# Patient Record
Sex: Male | Born: 1945
Health system: Southern US, Community
[De-identification: ages and names within clinical notes are randomized; demographics above are authoritative.]

## PROBLEM LIST (undated history)

## (undated) DIAGNOSIS — K5792 Diverticulitis of intestine, part unspecified, without perforation or abscess without bleeding: Secondary | ICD-10-CM

## (undated) DIAGNOSIS — N529 Male erectile dysfunction, unspecified: Secondary | ICD-10-CM

## (undated) DIAGNOSIS — T783XXA Angioneurotic edema, initial encounter: Secondary | ICD-10-CM

## (undated) DIAGNOSIS — N200 Calculus of kidney: Secondary | ICD-10-CM

## (undated) DIAGNOSIS — E785 Hyperlipidemia, unspecified: Secondary | ICD-10-CM

## (undated) DIAGNOSIS — L509 Urticaria, unspecified: Secondary | ICD-10-CM

## (undated) HISTORY — PX: POLYPECTOMY: SHX149

## (undated) HISTORY — PX: TONSILLECTOMY: SUR1361

## (undated) HISTORY — DX: Calculus of kidney: N20.0

## (undated) HISTORY — DX: Urticaria, unspecified: L50.9

## (undated) HISTORY — PX: OTHER SURGICAL HISTORY: SHX169

## (undated) HISTORY — DX: Diverticulitis of intestine, part unspecified, without perforation or abscess without bleeding: K57.92

## (undated) HISTORY — PX: COLONOSCOPY: SHX174

## (undated) HISTORY — DX: Male erectile dysfunction, unspecified: N52.9

## (undated) HISTORY — DX: Hyperlipidemia, unspecified: E78.5

## (undated) HISTORY — DX: Angioneurotic edema, initial encounter: T78.3XXA

---

## 2002-07-09 ENCOUNTER — Encounter: Payer: Self-pay | Admitting: Emergency Medicine

## 2002-07-09 ENCOUNTER — Emergency Department (HOSPITAL_COMMUNITY): Admission: EM | Admit: 2002-07-09 | Discharge: 2002-07-09 | Payer: Self-pay | Admitting: Emergency Medicine

## 2011-08-28 ENCOUNTER — Encounter: Payer: Self-pay | Admitting: Gastroenterology

## 2011-09-08 ENCOUNTER — Ambulatory Visit (AMBULATORY_SURGERY_CENTER): Payer: 59 | Admitting: *Deleted

## 2011-09-08 ENCOUNTER — Encounter: Payer: Self-pay | Admitting: Gastroenterology

## 2011-09-08 VITALS — Ht 74.0 in | Wt 220.0 lb

## 2011-09-08 DIAGNOSIS — Z1211 Encounter for screening for malignant neoplasm of colon: Secondary | ICD-10-CM

## 2011-09-08 MED ORDER — PEG-KCL-NACL-NASULF-NA ASC-C 100 G PO SOLR: ORAL | Status: DC

## 2011-09-25 ENCOUNTER — Encounter: Payer: Self-pay | Admitting: Gastroenterology

## 2011-09-25 ENCOUNTER — Ambulatory Visit (AMBULATORY_SURGERY_CENTER): Payer: 59 | Admitting: Gastroenterology

## 2011-09-25 VITALS — BP 134/85 | HR 66 | Temp 96.0°F | Resp 22 | Ht 74.0 in | Wt 220.0 lb

## 2011-09-25 DIAGNOSIS — D126 Benign neoplasm of colon, unspecified: Secondary | ICD-10-CM

## 2011-09-25 DIAGNOSIS — Z1211 Encounter for screening for malignant neoplasm of colon: Secondary | ICD-10-CM

## 2011-09-25 DIAGNOSIS — K5730 Diverticulosis of large intestine without perforation or abscess without bleeding: Secondary | ICD-10-CM

## 2011-09-25 MED ORDER — SODIUM CHLORIDE 0.9 % IV SOLN: 500.0000 mL | INTRAVENOUS | Status: DC

## 2011-09-25 NOTE — Progress Notes (Signed)
Patient did not experience any of the following events: a burn prior to discharge; a fall within the facility; wrong site/side/patient/procedure/implant event; or a hospital transfer or hospital admission upon discharge from the facility. (G8907) Patient did not have preoperative order for IV antibiotic SSI prophylaxis. (G8918)  

## 2011-09-25 NOTE — Patient Instructions (Signed)
YOU HAD AN ENDOSCOPIC PROCEDURE TODAY AT THE [facility]: Refer to the procedure report that was given to you for any specific questions about what was found during the examination.  If the procedure report does not answer your questions, please call your gastroenterologist to clarify.  If you requested that your care partner not be given the details of your procedure findings, then the procedure report has been included in a sealed envelope for you to review at your convenience later.  YOU SHOULD EXPECT: Some feelings of bloating in the abdomen. Passage of more gas than usual.  Walking can help get rid of the air that was put into your GI tract during the procedure and reduce the bloating. If you had a lower endoscopy (such as a colonoscopy or flexible sigmoidoscopy) you may notice spotting of blood in your stool or on the toilet paper. If you underwent a bowel prep for your procedure, then you may not have a normal bowel movement for a few days.  DIET: Your first meal following the procedure should be a light meal and then it is ok to progress to your normal diet.  A half-sandwich or bowl of soup is an example of a good first meal.  Heavy or fried foods are harder to digest and may make you feel nauseous or bloated.  Likewise meals heavy in dairy and vegetables can cause extra gas to form and this can also increase the bloating.  Drink plenty of fluids but you should avoid alcoholic beverages for 24 hours.  ACTIVITY: Your care partner should take you home directly after the procedure.  You should plan to take it easy, moving slowly for the rest of the day.  You can resume normal activity the day after the procedure however you should NOT DRIVE or use heavy machinery for 24 hours (because of the sedation medicines used during the test).    SYMPTOMS TO REPORT IMMEDIATELY: A gastroenterologist can be reached at any hour.  During normal business hours, 8:30 AM to 5:00 PM Monday through Friday,  call [phone].  After hours and on weekends, please call the GI answering service at [phone] who will take a message and have the physician on call contact you.   Following lower endoscopy (colonoscopy or flexible sigmoidoscopy):  Excessive amounts of blood in the stool  Significant tenderness or worsening of abdominal pains  Swelling of the abdomen that is new, acute  Fever of 100F or higher  Following upper endoscopy (EGD)  Vomiting of blood or coffee ground material  New chest pain or pain under the shoulder blades  Painful or persistently difficult swallowing  New shortness of breath  Fever of 100F or higher  Black, tarry-looking stools  FOLLOW UP: If any biopsies were taken you will be contacted by phone or by letter within the next 1-3 weeks.  Call your gastroenterologist if you have not heard about the biopsies in 3 weeks.  Our staff will call the home number listed on your records the next business day following your procedure to check on you and address any questions or concerns that you may have at that time regarding the information given to you following your procedure. This is a courtesy call and so if there is no answer at the home number and we have not heard from you through the emergency physician on call, we will assume that you have returned to your regular daily activities without incident.  SIGNATURES/CONFIDENTIALITY: You and/or your care   partner have signed paperwork which will be entered into your electronic medical record.  These signatures attest to the fact that that the information above on your After Visit Summary has been reviewed and is understood.  Full responsibility of the confidentiality of this discharge information lies with you and/or your care-partner.  

## 2011-09-25 NOTE — Op Note (Signed)
[facility] [address] [place], [place]  [address]  COLONOSCOPY PROCEDURE REPORT  PATIENT:  Dennis Diaz, Dennis Diaz  MR#:  [id number] BIRTHDATE:  21-Apr-1946, 66 yrs. old  GENDER:  male ENDOSCOPIST:  Barbette Hair. Arlyce Dice, MD REF. BY:  Nila Nephew, M.D. PROCEDURE DATE:  09/25/2011 PROCEDURE:  Colonoscopy with snare polypectomy, Colon with cold biopsy polypectomy ASA CLASS:  Class I INDICATIONS:  Routine Risk Screening MEDICATIONS:   MAC sedation, administered by CRNA propofol 350mg IV  DESCRIPTION OF PROCEDURE:   After the risks benefits and alternatives of the procedure were thoroughly explained, informed consent was obtained.  Digital rectal exam was performed and revealed no abnormalities.   The [id number] endoscope was introduced through the anus and advanced to the cecum, which was identified by both the appendix and ileocecal valve, without limitations.  The quality of the prep was good, using MoviPrep. The instrument was then slowly withdrawn as the colon was fully examined. <<PROCEDUREIMAGES>>  FINDINGS:  A sessile polyp was found at the hepatic flexure. It was 3 mm in size. Polyp was snared without cautery. Retrieval was successful (see image3). snare polyp  A sessile polyp was found in the distal transverse colon. It was 2 mm in size. The polyp was removed using cold biopsy forceps (see image5).  Mild diverticulosis was found in the sigmoid colon (see image7).  This was otherwise a normal examination of the colon (see image2 and image8).   Retroflexed views in the rectum revealed no abnormalities.    The time to cecum =  1) 1.75  minutes. The scope was then withdrawn in  1) 11.75  minutes from the cecum and the procedure completed. COMPLICATIONS:  None ENDOSCOPIC IMPRESSION: 1) 3 mm sessile polyp at the hepatic flexure 2) 2 mm sessile polyp in the distal transverse colon 3) Mild diverticulosis in the sigmoid colon 4) Otherwise normal  examination RECOMMENDATIONS: 1) If the polyp(s) removed today are proven to be adenomatous (pre-cancerous) polyps, you will need a repeat colonoscopy in 5 years. Otherwise you should continue to follow colorectal cancer screening guidelines for "routine risk" patients with colonoscopy in 10 years. You will receive a letter within 1-2 weeks with the results of your biopsy as well as final recommendations. Please call my office if you have not received a letter after 3 weeks. REPEAT EXAM:   You will receive a letter from Dr. Arlyce Dice in 1-2 weeks, after reviewing the final pathology, with followup recommendations.  ______________________________ Barbette Hair Arlyce Dice, MD  CC:  n. eSIGNED:   Barbette Hair. Goku Harb at 09/25/2011 09:22 AM  Florentina Jenny, [phone]

## 2011-09-25 NOTE — Progress Notes (Signed)
PROPOFOL PER S CAMP CRNA. SEE SCANNED INTRA PROCEDURE REPORT. EWM 

## 2011-09-29 ENCOUNTER — Telehealth: Payer: Self-pay | Admitting: *Deleted

## 2011-09-29 NOTE — Telephone Encounter (Signed)
  Follow up Call-  Call back number 09/25/2011  Post procedure Call Back phone  # [phone]  Permission to leave phone message Yes     No answer at #given.

## 2011-10-03 ENCOUNTER — Encounter: Payer: Self-pay | Admitting: Gastroenterology

## 2012-03-04 ENCOUNTER — Ambulatory Visit (INDEPENDENT_AMBULATORY_CARE_PROVIDER_SITE_OTHER): Payer: 59 | Admitting: Physician Assistant

## 2012-03-04 VITALS — BP 132/82 | HR 98 | Temp 98.3°F | Resp 14 | Ht 70.5 in | Wt 220.4 lb

## 2012-03-04 DIAGNOSIS — L03319 Cellulitis of trunk, unspecified: Secondary | ICD-10-CM

## 2012-03-04 DIAGNOSIS — L02219 Cutaneous abscess of trunk, unspecified: Secondary | ICD-10-CM

## 2012-03-04 MED ORDER — DOXYCYCLINE HYCLATE 100 MG PO CAPS: 100.0000 mg | ORAL_CAPSULE | Freq: Two times a day (BID) | ORAL | Status: AC

## 2012-03-04 NOTE — Patient Instructions (Signed)
Apply warm compresses to the area for 15-20 minutes 2-3 times daily.

## 2012-03-04 NOTE — Progress Notes (Signed)
  Subjective:    Patient ID: Dennis Diaz, male    DOB: 1946-04-25, 66 y.o.   MRN: [id number]  HPI This 66 y.o. male presents for evaluation of bite on the left hip, which he is concerned may be infected.  Had returned from the beach several days ago and noticed a bug on the hip while showering.  It was flat, like a tick, and he removed it, but did not examine it closely.  The site has become progressively red and a little swollen.  No fever, chills, nausea, abdominal pain, HA, dizziness, unexplained myalgias or arthralgias.   Review of Systems As above.   Past Medical History  Diagnosis Date  . Kidney stones   . ED (erectile dysfunction)     Past Surgical History  Procedure Date  . Root canal     and redo    Prior to Admission medications   Medication Sig Start Date End Date Taking? Authorizing Provider  VIAGRA 50 MG tablet Take 1 tablet by mouth as needed. 08/01/11   Historical Provider, MD    No Known Allergies  History   Social History  . Marital Status: Married    Spouse Name: Cotten    Number of Children: 2  . Years of Education: 18   Occupational History  . VP of Operations [employer]   Social History Main Topics  . Smoking status: Never Smoker   . Smokeless tobacco: Never Used  . Alcohol Use: 2.4 oz/week    4 Glasses of wine per week  . Drug Use: No  . Sexually Active: Yes -- Male partner(s)   Other Topics Concern  . Not on file   Social History Narrative  . No narrative on file    Family History  Problem Relation Age of Onset  . Alzheimer's disease Mother   . Heart disease Father 56    CABG  . Diabetes Father        Objective:   Physical Exam  Vitals reviewed. Constitutional: He is oriented to person, place, and time. Vital signs are normal. He appears well-developed and well-nourished. He is active and cooperative. No distress.  HENT:  Head: Normocephalic and atraumatic.  Right Ear: Hearing normal.  Left Ear: Hearing normal.    Eyes: EOM are normal. Pupils are equal, round, and reactive to light.  Pulmonary/Chest: Effort normal.  Neurological: He is alert and oriented to person, place, and time. No sensory deficit.  Skin: Skin is warm and dry. Lesion noted. There is erythema.     Psychiatric: He has a normal mood and affect. His speech is normal and behavior is normal. Judgment normal.      Assessment & Plan:   1. Cellulitis and abscess of trunk  doxycycline (VIBRAMYCIN) 100 MG capsule   Cover for tick-borne illness as above, though no current symptoms.  RTC if symptoms worsen or persist.

## 2014-02-17 ENCOUNTER — Encounter: Payer: Self-pay | Admitting: Gastroenterology

## 2015-04-15 ENCOUNTER — Emergency Department (HOSPITAL_BASED_OUTPATIENT_CLINIC_OR_DEPARTMENT_OTHER): Payer: Medicare Other

## 2015-04-15 ENCOUNTER — Inpatient Hospital Stay (HOSPITAL_BASED_OUTPATIENT_CLINIC_OR_DEPARTMENT_OTHER)
Admission: EM | Admit: 2015-04-15 | Discharge: 2015-04-22 | DRG: 392 | Disposition: A | Discharge status: Home / Self Care | Payer: Medicare Other | Attending: Internal Medicine | Admitting: Internal Medicine

## 2015-04-15 ENCOUNTER — Encounter (HOSPITAL_BASED_OUTPATIENT_CLINIC_OR_DEPARTMENT_OTHER): Payer: Self-pay | Admitting: *Deleted

## 2015-04-15 DIAGNOSIS — Z8249 Family history of ischemic heart disease and other diseases of the circulatory system: Secondary | ICD-10-CM

## 2015-04-15 DIAGNOSIS — Z79899 Other long term (current) drug therapy: Secondary | ICD-10-CM

## 2015-04-15 DIAGNOSIS — Z833 Family history of diabetes mellitus: Secondary | ICD-10-CM

## 2015-04-15 DIAGNOSIS — Z820 Family history of epilepsy and other diseases of the nervous system: Secondary | ICD-10-CM | POA: Diagnosis not present

## 2015-04-15 DIAGNOSIS — R918 Other nonspecific abnormal finding of lung field: Secondary | ICD-10-CM

## 2015-04-15 DIAGNOSIS — I493 Ventricular premature depolarization: Secondary | ICD-10-CM | POA: Diagnosis not present

## 2015-04-15 DIAGNOSIS — K5720 Diverticulitis of large intestine with perforation and abscess without bleeding: Secondary | ICD-10-CM | POA: Diagnosis not present

## 2015-04-15 DIAGNOSIS — Z87442 Personal history of urinary calculi: Secondary | ICD-10-CM

## 2015-04-15 DIAGNOSIS — R509 Fever, unspecified: Secondary | ICD-10-CM | POA: Diagnosis not present

## 2015-04-15 DIAGNOSIS — R109 Unspecified abdominal pain: Secondary | ICD-10-CM | POA: Diagnosis not present

## 2015-04-15 DIAGNOSIS — K5780 Diverticulitis of intestine, part unspecified, with perforation and abscess without bleeding: Secondary | ICD-10-CM | POA: Diagnosis not present

## 2015-04-15 DIAGNOSIS — R000 Tachycardia, unspecified: Secondary | ICD-10-CM | POA: Diagnosis not present

## 2015-04-15 LAB — URINALYSIS, ROUTINE W REFLEX MICROSCOPIC
Bilirubin Urine: NEGATIVE
Glucose, UA: NEGATIVE mg/dL
HGB URINE DIPSTICK: NEGATIVE
Ketones, ur: 40 mg/dL — AB
Leukocytes, UA: NEGATIVE
Nitrite: NEGATIVE
Protein, ur: NEGATIVE mg/dL
SPECIFIC GRAVITY, URINE: 1.025 (ref 1.005–1.030)
UROBILINOGEN UA: 0.2 mg/dL (ref 0.0–1.0)
pH: 5 (ref 5.0–8.0)

## 2015-04-15 LAB — CBC WITH DIFFERENTIAL/PLATELET
Basophils Absolute: 0 10*3/uL (ref 0.0–0.1)
Basophils Relative: 0 %
Eosinophils Absolute: 0 10*3/uL (ref 0.0–0.7)
Eosinophils Relative: 0 %
HEMATOCRIT: 45.1 % (ref 39.0–52.0)
HEMOGLOBIN: 15.4 g/dL (ref 13.0–17.0)
LYMPHS ABS: 0.6 10*3/uL — AB (ref 0.7–4.0)
Lymphocytes Relative: 5 %
MCH: 29.1 pg (ref 26.0–34.0)
MCHC: 34.1 g/dL (ref 30.0–36.0)
MCV: 85.1 fL (ref 78.0–100.0)
MONO ABS: 0.5 10*3/uL (ref 0.1–1.0)
MONOS PCT: 5 %
NEUTROS ABS: 10.8 10*3/uL — AB (ref 1.7–7.7)
NEUTROS PCT: 90 %
Platelets: 199 10*3/uL (ref 150–400)
RBC: 5.3 MIL/uL (ref 4.22–5.81)
RDW: 13.3 % (ref 11.5–15.5)
WBC: 12.1 10*3/uL — ABNORMAL HIGH (ref 4.0–10.5)

## 2015-04-15 LAB — COMPREHENSIVE METABOLIC PANEL
ALK PHOS: 73 U/L (ref 38–126)
ALT: 23 U/L (ref 17–63)
ANION GAP: 9 (ref 5–15)
AST: 24 U/L (ref 15–41)
Albumin: 4.5 g/dL (ref 3.5–5.0)
BILIRUBIN TOTAL: 1.1 mg/dL (ref 0.3–1.2)
BUN: 14 mg/dL (ref 6–20)
CALCIUM: 9 mg/dL (ref 8.9–10.3)
CO2: 26 mmol/L (ref 22–32)
Chloride: 103 mmol/L (ref 101–111)
Creatinine, Ser: 1.04 mg/dL (ref 0.61–1.24)
GLUCOSE: 108 mg/dL — AB (ref 65–99)
Potassium: 4 mmol/L (ref 3.5–5.1)
Sodium: 138 mmol/L (ref 135–145)
TOTAL PROTEIN: 7.7 g/dL (ref 6.5–8.1)

## 2015-04-15 LAB — I-STAT CG4 LACTIC ACID, ED
LACTIC ACID, VENOUS: 1.35 mmol/L (ref 0.5–2.0)
LACTIC ACID, VENOUS: 1.05 mmol/L (ref 0.5–2.0)

## 2015-04-15 LAB — LIPASE, BLOOD: LIPASE: 25 U/L (ref 22–51)

## 2015-04-15 MED ORDER — ENOXAPARIN SODIUM 40 MG/0.4ML ~~LOC~~ SOLN
40.0000 mg | Freq: Every day | SUBCUTANEOUS | Status: DC
  Administered 2015-04-15 – 2015-04-21 (×7): 40 mg via SUBCUTANEOUS
  Filled 2015-04-15 (×7): qty 0.4

## 2015-04-15 MED ORDER — ACETAMINOPHEN 325 MG PO TABS
ORAL_TABLET | ORAL | Status: AC
  Filled 2015-04-15: qty 2

## 2015-04-15 MED ORDER — IOHEXOL 300 MG/ML  SOLN
25.0000 mL | Freq: Once | INTRAMUSCULAR | Status: AC | PRN
  Administered 2015-04-15: 25 mL via ORAL

## 2015-04-15 MED ORDER — ONDANSETRON HCL 4 MG PO TABS: 4.0000 mg | ORAL_TABLET | Freq: Four times a day (QID) | ORAL | Status: DC | PRN

## 2015-04-15 MED ORDER — OXYCODONE HCL 5 MG PO TABS
5.0000 mg | ORAL_TABLET | ORAL | Status: DC | PRN
  Administered 2015-04-16 – 2015-04-17 (×5): 5 mg via ORAL
  Filled 2015-04-15 (×5): qty 1

## 2015-04-15 MED ORDER — SODIUM CHLORIDE 0.9 % IJ SOLN
3.0000 mL | Freq: Two times a day (BID) | INTRAMUSCULAR | Status: DC
  Administered 2015-04-16 – 2015-04-22 (×7): 3 mL via INTRAVENOUS

## 2015-04-15 MED ORDER — METRONIDAZOLE IN NACL 5-0.79 MG/ML-% IV SOLN
500.0000 mg | Freq: Once | INTRAVENOUS | Status: AC
  Administered 2015-04-15: 500 mg via INTRAVENOUS
  Filled 2015-04-15: qty 100

## 2015-04-15 MED ORDER — CIPROFLOXACIN IN D5W 400 MG/200ML IV SOLN
400.0000 mg | Freq: Two times a day (BID) | INTRAVENOUS | Status: DC
  Filled 2015-04-15: qty 200

## 2015-04-15 MED ORDER — METRONIDAZOLE IN NACL 5-0.79 MG/ML-% IV SOLN
500.0000 mg | Freq: Three times a day (TID) | INTRAVENOUS | Status: DC
  Filled 2015-04-15: qty 100

## 2015-04-15 MED ORDER — ACETAMINOPHEN 325 MG PO TABS
650.0000 mg | ORAL_TABLET | Freq: Four times a day (QID) | ORAL | Status: DC | PRN
  Administered 2015-04-16 – 2015-04-20 (×5): 650 mg via ORAL
  Filled 2015-04-15 (×5): qty 2

## 2015-04-15 MED ORDER — ACETAMINOPHEN 650 MG RE SUPP
650.0000 mg | Freq: Four times a day (QID) | RECTAL | Status: DC | PRN
Start: 2015-04-15 — End: 2015-04-22

## 2015-04-15 MED ORDER — SODIUM CHLORIDE 0.9 % IV SOLN
INTRAVENOUS | Status: DC
  Administered 2015-04-15 – 2015-04-19 (×8): via INTRAVENOUS

## 2015-04-15 MED ORDER — ONDANSETRON HCL 4 MG/2ML IJ SOLN: 4.0000 mg | Freq: Four times a day (QID) | INTRAMUSCULAR | Status: DC | PRN

## 2015-04-15 MED ORDER — INFLUENZA VAC SPLIT QUAD 0.5 ML IM SUSY
0.5000 mL | PREFILLED_SYRINGE | INTRAMUSCULAR | Status: DC
  Filled 2015-04-15 (×2): qty 0.5

## 2015-04-15 MED ORDER — ACETAMINOPHEN 325 MG PO TABS
650.0000 mg | ORAL_TABLET | Freq: Once | ORAL | Status: AC
  Administered 2015-04-15: 650 mg via ORAL

## 2015-04-15 MED ORDER — MORPHINE SULFATE (PF) 2 MG/ML IV SOLN
2.0000 mg | INTRAVENOUS | Status: DC | PRN
  Administered 2015-04-15 – 2015-04-18 (×3): 2 mg via INTRAVENOUS
  Filled 2015-04-15 (×3): qty 1

## 2015-04-15 MED ORDER — PIPERACILLIN-TAZOBACTAM 3.375 G IVPB
3.3750 g | Freq: Three times a day (TID) | INTRAVENOUS | Status: DC
  Administered 2015-04-15 – 2015-04-21 (×18): 3.375 g via INTRAVENOUS
  Filled 2015-04-15 (×19): qty 50

## 2015-04-15 MED ORDER — SODIUM CHLORIDE 0.9 % IV SOLN: Freq: Once | INTRAVENOUS | Status: DC

## 2015-04-15 MED ORDER — SODIUM CHLORIDE 0.9 % IV BOLUS (SEPSIS)
1000.0000 mL | Freq: Once | INTRAVENOUS | Status: AC
  Administered 2015-04-15: 1000 mL via INTRAVENOUS

## 2015-04-15 MED ORDER — IOHEXOL 300 MG/ML  SOLN
100.0000 mL | Freq: Once | INTRAMUSCULAR | Status: AC | PRN
  Administered 2015-04-15: 100 mL via INTRAVENOUS

## 2015-04-15 MED ORDER — CIPROFLOXACIN IN D5W 400 MG/200ML IV SOLN
400.0000 mg | Freq: Once | INTRAVENOUS | Status: AC
  Administered 2015-04-15: 400 mg via INTRAVENOUS
  Filled 2015-04-15: qty 200

## 2015-04-15 MED ORDER — ONDANSETRON HCL 4 MG/2ML IJ SOLN
4.0000 mg | Freq: Once | INTRAMUSCULAR | Status: AC
  Administered 2015-04-15: 4 mg via INTRAVENOUS
  Filled 2015-04-15: qty 2

## 2015-04-15 MED ORDER — CETYLPYRIDINIUM CHLORIDE 0.05 % MT LIQD
7.0000 mL | Freq: Two times a day (BID) | OROMUCOSAL | Status: DC
  Administered 2015-04-16 – 2015-04-19 (×8): 7 mL via OROMUCOSAL

## 2015-04-15 MED ORDER — SODIUM CHLORIDE 0.9 % IV SOLN
INTRAVENOUS | Status: DC
Start: 2015-04-15 — End: 2015-04-15
  Administered 2015-04-15: 20:00:00 via INTRAVENOUS

## 2015-04-15 MED ORDER — SODIUM CHLORIDE 0.9 % IV SOLN
Freq: Once | INTRAVENOUS | Status: AC
  Administered 2015-04-15: 17:00:00 via INTRAVENOUS

## 2015-04-15 MED ORDER — MORPHINE SULFATE (PF) 4 MG/ML IV SOLN
4.0000 mg | Freq: Once | INTRAVENOUS | Status: AC
  Administered 2015-04-15: 4 mg via INTRAVENOUS
  Filled 2015-04-15: qty 1

## 2015-04-15 NOTE — Progress Notes (Addendum)
ANTIBIOTIC CONSULT NOTE - INITIAL  Pharmacy Consult for Ciprofloxacin (d/c)/ zosyn Indication: intra-abdominal infection  Allergies  Allergen Reactions  . Shellfish Allergy Hives    Patient Measurements: Height: 6\' 2"  (188 cm) Weight: 215 lb (97.523 kg) IBW/kg (Calculated) : 82.2 Adjusted Body Weight:   Vital Signs: Temp: 101.2 F (38.4 C) (10/16 2125) Temp Source: Oral (10/16 2125) BP: 111/68 mmHg (10/16 2125) Pulse Rate: 93 (10/16 2125) Intake/Output from previous day:   Intake/Output from this shift: Total I/O In: -  Out: 600 [Urine:600]  Labs:  Recent Labs  04/15/15 1710  WBC 12.1*  HGB 15.4  PLT 199  CREATININE 1.04   Estimated Creatinine Clearance: 79 mL/min (by C-G formula based on Cr of 1.04). No results for input(s): VANCOTROUGH, VANCOPEAK, VANCORANDOM, GENTTROUGH, GENTPEAK, GENTRANDOM, TOBRATROUGH, TOBRAPEAK, TOBRARND, AMIKACINPEAK, AMIKACINTROU, AMIKACIN in the last 72 hours.   Microbiology: No results found for this or any previous visit (from the past 720 hour(s)).  Medical History: Past Medical History  Diagnosis Date  . Kidney stones   . ED (erectile dysfunction)     Medications:  Anti-infectives    Start     Dose/Rate Route Frequency Ordered Stop   04/16/15 0600  metroNIDAZOLE (FLAGYL) IVPB 500 mg     500 mg 100 mL/hr over 60 Minutes Intravenous 3 times per day 04/15/15 2213     04/16/15 0600  ciprofloxacin (CIPRO) IVPB 400 mg     400 mg 200 mL/hr over 60 Minutes Intravenous Every 12 hours 04/15/15 2225     04/15/15 1845  metroNIDAZOLE (FLAGYL) IVPB 500 mg     500 mg 100 mL/hr over 60 Minutes Intravenous  Once 04/15/15 1839 04/15/15 2000   04/15/15 1845  ciprofloxacin (CIPRO) IVPB 400 mg     400 mg 200 mL/hr over 60 Minutes Intravenous  Once 04/15/15 1839 04/15/15 1950     Assessment: Patient with abdominal pain x 1 day in ED.  Sigmoid diverticulitis with microperforation noted on CT.    Goal of Therapy:  Ciprofloxacin dosed  based on patient weight and renal function   Plan:  Follow up culture results  Ciprofloxacin 400mg  iv q12hr  Tyler Deis, Shea Stakes Crowford 04/15/2015,10:30 PM   Change antibiotic to zosyn Zosyn 3.375g IV Q8H infused over 4hrs.

## 2015-04-15 NOTE — ED Notes (Signed)
Pt reports he developed low abd pain yesterday around 1200 that has progressively worsened. Denies v/d; reports mild nausea, chills.

## 2015-04-15 NOTE — ED Notes (Signed)
Report given to [place] [address] at [facility].

## 2015-04-15 NOTE — Consult Note (Signed)
Reason for Consult: Acute diverticulitis Referring Physician: Dr. Braulio Bosch is an 69 y.o. male.  HPI: He was in his usual state of health until yesterday when he noted some abdominal bloating. Today he noticed more bloating than severe cramping lower abdominal pain. He went to the [facility] in [facility] and was evaluated. He was noted to have a mild leukocytosis. CT scan demonstrated findings consistent with acute sigmoid diverticulitis. He was transferred here for further treatment. He denies a previous history of diverticulitis.  Past Medical History  Diagnosis Date  . Kidney stones   . ED (erectile dysfunction)     Past Surgical History  Procedure Laterality Date  . Root canal      and redo    Family History  Problem Relation Age of Onset  . Alzheimer's disease Mother   . Heart disease Father 58    CABG  . Diabetes Father     Social History:  reports that he has never smoked. He has never used smokeless tobacco. He reports that he drinks about 2.4 oz of alcohol per week. He reports that he does not use illicit drugs.  Allergies:  Allergies  Allergen Reactions  . Shellfish Allergy Hives    Prior to Admission medications   Medication Sig Start Date End Date Taking? Authorizing Provider  diclofenac (VOLTAREN) 75 MG EC tablet Take 75 mg by mouth 2 (two) times daily as needed (joint pain).  03/20/15  Yes Historical Provider, MD  ibuprofen (ADVIL,MOTRIN) 200 MG tablet Take 400-600 mg by mouth daily as needed for headache.   Yes Historical Provider, MD  Oxygen Permeable Lens Products (BOSTON SIMPLUS) SOLN 1 application by Does not apply route daily. Clean out contacts daily   Yes Historical Provider, MD  VIAGRA 50 MG tablet Take 1 tablet by mouth as needed. 08/01/11  Yes Historical Provider, MD     Results for orders placed or performed during the hospital encounter of 04/15/15 (from the past 48 hour(s))  CBC with Differential/Platelet     Status: Abnormal   Collection Time: 04/15/15  5:10 PM  Result Value Ref Range   WBC 12.1 (H) 4.0 - 10.5 K/uL   RBC 5.30 4.22 - 5.81 MIL/uL   Hemoglobin 15.4 13.0 - 17.0 g/dL   HCT 45.1 39.0 - 52.0 %   MCV 85.1 78.0 - 100.0 fL   MCH 29.1 26.0 - 34.0 pg   MCHC 34.1 30.0 - 36.0 g/dL   RDW 13.3 11.5 - 15.5 %   Platelets 199 150 - 400 K/uL   Neutrophils Relative % 90 %   Neutro Abs 10.8 (H) 1.7 - 7.7 K/uL   Lymphocytes Relative 5 %   Lymphs Abs 0.6 (L) 0.7 - 4.0 K/uL   Monocytes Relative 5 %   Monocytes Absolute 0.5 0.1 - 1.0 K/uL   Eosinophils Relative 0 %   Eosinophils Absolute 0.0 0.0 - 0.7 K/uL   Basophils Relative 0 %   Basophils Absolute 0.0 0.0 - 0.1 K/uL  Comprehensive metabolic panel     Status: Abnormal   Collection Time: 04/15/15  5:10 PM  Result Value Ref Range   Sodium 138 135 - 145 mmol/L   Potassium 4.0 3.5 - 5.1 mmol/L   Chloride 103 101 - 111 mmol/L   CO2 26 22 - 32 mmol/L   Glucose, Bld 108 (H) 65 - 99 mg/dL   BUN 14 6 - 20 mg/dL   Creatinine, Ser 1.04 0.61 - 1.24 mg/dL  Calcium 9.0 8.9 - 10.3 mg/dL   Total Protein 7.7 6.5 - 8.1 g/dL   Albumin 4.5 3.5 - 5.0 g/dL   AST 24 15 - 41 U/L   ALT 23 17 - 63 U/L   Alkaline Phosphatase 73 38 - 126 U/L   Total Bilirubin 1.1 0.3 - 1.2 mg/dL   GFR calc non Af Amer >60 >60 mL/min   GFR calc Af Amer >60 >60 mL/min    Comment: (NOTE) The eGFR has been calculated using the CKD EPI equation. This calculation has not been validated in all clinical situations. eGFR's persistently <60 mL/min signify possible Chronic Kidney Disease.    Anion gap 9 5 - 15  Lipase, blood     Status: None   Collection Time: 04/15/15  5:10 PM  Result Value Ref Range   Lipase 25 22 - 51 U/L  I-Stat CG4 Lactic Acid, ED     Status: None   Collection Time: 04/15/15  5:17 PM  Result Value Ref Range   Lactic Acid, Venous 1.35 0.5 - 2.0 mmol/L  Urinalysis, Routine w reflex microscopic (not at [facility])     Status: Abnormal   Collection Time: 04/15/15  6:03 PM  Result  Value Ref Range   Color, Urine YELLOW YELLOW   APPearance CLEAR CLEAR   Specific Gravity, Urine 1.025 1.005 - 1.030   pH 5.0 5.0 - 8.0   Glucose, UA NEGATIVE NEGATIVE mg/dL   Hgb urine dipstick NEGATIVE NEGATIVE   Bilirubin Urine NEGATIVE NEGATIVE   Ketones, ur 40 (A) NEGATIVE mg/dL   Protein, ur NEGATIVE NEGATIVE mg/dL   Urobilinogen, UA 0.2 0.0 - 1.0 mg/dL   Nitrite NEGATIVE NEGATIVE   Leukocytes, UA NEGATIVE NEGATIVE    Comment: MICROSCOPIC NOT DONE ON URINES WITH NEGATIVE PROTEIN, BLOOD, LEUKOCYTES, NITRITE, OR GLUCOSE <1000 mg/dL.  I-Stat CG4 Lactic Acid, ED     Status: None   Collection Time: 04/15/15  8:00 PM  Result Value Ref Range   Lactic Acid, Venous 1.05 0.5 - 2.0 mmol/L    Ct Abdomen Pelvis W Contrast  04/15/2015  CLINICAL DATA:  Right lower quadrant abdominal pain for one day EXAM: CT ABDOMEN AND PELVIS WITH CONTRAST TECHNIQUE: Multidetector CT imaging of the abdomen and pelvis was performed using the standard protocol following bolus administration of intravenous contrast. CONTRAST:  78m OMNIPAQUE IOHEXOL 300 MG/ML SOLN, 1077mOMNIPAQUE IOHEXOL 300 MG/ML SOLN COMPARISON:  None. FINDINGS: Lower chest:  Normal Hepatobiliary: 7 mm low-attenuation lesion in the dome of the liver on the left consistent with a cyst with average attenuation value of 3 Pancreas: Normal Spleen: Normal Adrenals/Urinary Tract: Adrenal glands are normal. 15 mm upper pole right renal cyst. Left kidney and bladder normal. Stomach/Bowel: There is moderate diverticulosis of the sigmoid colon. There is moderate surrounding inflammatory change. There are a few bubbles of free air in the surrounding mesentery. There is a right inguinal hernia which contains a few loops of small bowel. There is no evidence of obstruction. There is a small volume of free fluid within the inguinal hernia. There is also a small volume of free fluid within the pelvis dependently. The appendix is not dilated with no evidence of wall  thickening. There is mild inflammatory change in the region of the appendix and cecal tip. Vascular/Lymphatic: Mild atherosclerotic calcification of the aorta Reproductive:  normal Other: Small volume free fluid. Musculoskeletal: No acute findings IMPRESSION: Findings consistent with sigmoid colon diverticulitis. There is evidence of micro perforation with no evidence  of abscess. There is a small volume of free fluid dependently within the pelvis as well as within a right inguinal hernia. The right inguinal hernia contains a few loops of small bowel which show no wall thickening or inflammation in the fluid is likely present within the inguinal hernia as a result of gravity. There is also mild inflammatory change with trace free fluid in the right pericolic gutter near the appendix and cecal tip. The appendix and cecum appear otherwise entirely normal. It is considered unlikely that there is a second focus of inflammation involving either the appendix were the cecum, but more likely that the inflammation and trace free fluid are the result of the diverticulitis elsewhere in the pelvis. Critical Value/emergent results were called by telephone at the time of interpretation on 04/15/2015 at 6:35 pm to Dr. Ezequiel Essex , who verbally acknowledged these results. Electronically Signed   By: Skipper Cliche M.D.   On: 04/15/2015 18:39    Review of Systems  Constitutional: Positive for fever.  Respiratory: Negative for cough and shortness of breath.   Cardiovascular: Negative for chest pain.  Gastrointestinal: Positive for abdominal pain, constipation and blood in stool. Negative for vomiting and diarrhea.  Genitourinary: Negative for dysuria and hematuria.   Blood pressure 111/68, pulse 93, temperature 101.2 F (38.4 C), temperature source Oral, resp. rate 20, height 6' 2"  (1.88 m), weight 97.523 kg (215 lb), SpO2 99 %. Physical Exam  Constitutional: He appears well-developed and well-nourished. No  distress.  HENT:  Head: Normocephalic and atraumatic.  GI: Soft. He exhibits no mass. There is tenderness (Bilateral lower quadrants.). There is no guarding.  Neurological: He is alert.  Skin:  Skin is flushed with increased warmth  Psychiatric: He has a normal mood and affect. His behavior is normal.    Assessment/Plan: Acute sigmoid diverticulitis. I do not think he has a second process (appendicitis) in the right lower quadrant. No evidence of abscess.  Plan: Broad-spectrum antibiotics-would switch him to Zosyn. Bowel rest. Monitor clinical course.  Shamonique Battiste J 04/15/2015, 11:29 PM

## 2015-04-15 NOTE — ED Provider Notes (Signed)
CSN: [id number]     Arrival date & time 04/15/15  1633 History  By signing my name below, I, Helane Gunther, attest that this documentation has been prepared under the direction and in the presence of Ezequiel Essex, MD. Electronically Signed: Helane Gunther, ED Scribe. 04/15/2015. 5:13 PM.    Chief Complaint  Patient presents with  . Abdominal Pain   The history is provided by the patient. No language interpreter was used.   HPI Comments: Dennis Diaz is a 69 y.o. male who presents to the Emergency Department complaining of non-radiating, cramping, worsening, constant, lower abdominal pain onset 1 day ago. Pt states he began feeling gassy at first, but then the pain continued to worsen. He reports associated loss of appetite. He notes a decreased amount of BM's over the past few days (normal one yesterday, small one today). He states he usually has one every day. He reports his last PO was last night. He reports a PMHx of kidney stones, but states this pain does not feel similar. He states he had a gout flare up in January when his uric acid levels were high. He denies a PSHx of abdominal surgery. He denies a PMHx of heart and lung problems. Pt denies vomiting, dysuria, back pain, and testicular pain.  Past Medical History  Diagnosis Date  . Kidney stones   . ED (erectile dysfunction)    Past Surgical History  Procedure Laterality Date  . Root canal      and redo   Family History  Problem Relation Age of Onset  . Alzheimer's disease Mother   . Heart disease Father 51    CABG  . Diabetes Father    Social History  Substance Use Topics  . Smoking status: Never Smoker   . Smokeless tobacco: Never Used  . Alcohol Use: 2.4 oz/week    4 Glasses of wine per week    Review of Systems A complete 10 system review of systems was obtained and all systems are negative except as noted in the HPI and PMH.   Allergies  Review of patient's allergies indicates no known allergies.  Home  Medications   Prior to Admission medications   Medication Sig Start Date End Date Taking? Authorizing Provider  VIAGRA 50 MG tablet Take 1 tablet by mouth as needed. 08/01/11   Historical Provider, MD   BP 112/73 mmHg  Pulse 106  Temp(Src) 103.1 F (39.5 C) (Oral)  Resp 20  Ht 6\' 2"  (1.88 m)  Wt 215 lb (97.523 kg)  BMI 27.59 kg/m2  SpO2 94% Physical Exam  Constitutional: He is oriented to person, place, and time. He appears well-developed and well-nourished. No distress.  HENT:  Head: Normocephalic and atraumatic.  Mouth/Throat: Oropharynx is clear and moist. No oropharyngeal exudate.  Eyes: Conjunctivae and EOM are normal. Pupils are equal, round, and reactive to light.  Neck: Normal range of motion. Neck supple.  No meningismus.  Cardiovascular: Normal rate, regular rhythm, normal heart sounds and intact distal pulses.   No murmur heard. Pulmonary/Chest: Effort normal and breath sounds normal. No respiratory distress.  Abdominal: Soft. There is tenderness. There is guarding. There is no rebound.  Periumbilical and RLQ TTP with guarding  Genitourinary:  No testicular pain  Musculoskeletal: Normal range of motion. He exhibits no edema or tenderness.  No CVA pain  Neurological: He is alert and oriented to person, place, and time. No cranial nerve deficit. He exhibits normal muscle tone. Coordination normal.  No ataxia on  finger to nose bilaterally. No pronator drift. 5/5 strength throughout. CN 2-12 intact. Negative Romberg. Equal grip strength. Sensation intact. Gait is normal.   Skin: Skin is warm.  Psychiatric: He has a normal mood and affect. His behavior is normal.  Nursing note and vitals reviewed.   ED Course  Procedures  DIAGNOSTIC STUDIES: Oxygen Saturation is 97% on RA, adequate by my interpretation.    COORDINATION OF CARE: 5:03 PM - Discussed plans to order diagnostic studies and imaging. Pt advised of plan for treatment and pt agrees.  Labs Review Labs  Reviewed  URINALYSIS, ROUTINE W REFLEX MICROSCOPIC (NOT AT [facility]) - Abnormal; Notable for the following:    Ketones, ur 40 (*)    All other components within normal limits  CBC WITH DIFFERENTIAL/PLATELET - Abnormal; Notable for the following:    WBC 12.1 (*)    Neutro Abs 10.8 (*)    Lymphs Abs 0.6 (*)    All other components within normal limits  COMPREHENSIVE METABOLIC PANEL - Abnormal; Notable for the following:    Glucose, Bld 108 (*)    All other components within normal limits  LIPASE, BLOOD  I-STAT CG4 LACTIC ACID, ED  I-STAT CG4 LACTIC ACID, ED    Imaging Review Ct Abdomen Pelvis W Contrast  04/15/2015  CLINICAL DATA:  Right lower quadrant abdominal pain for one day EXAM: CT ABDOMEN AND PELVIS WITH CONTRAST TECHNIQUE: Multidetector CT imaging of the abdomen and pelvis was performed using the standard protocol following bolus administration of intravenous contrast. CONTRAST:  56mL OMNIPAQUE IOHEXOL 300 MG/ML SOLN, 170mL OMNIPAQUE IOHEXOL 300 MG/ML SOLN COMPARISON:  None. FINDINGS: Lower chest:  Normal Hepatobiliary: 7 mm low-attenuation lesion in the dome of the liver on the left consistent with a cyst with average attenuation value of 3 Pancreas: Normal Spleen: Normal Adrenals/Urinary Tract: Adrenal glands are normal. 15 mm upper pole right renal cyst. Left kidney and bladder normal. Stomach/Bowel: There is moderate diverticulosis of the sigmoid colon. There is moderate surrounding inflammatory change. There are a few bubbles of free air in the surrounding mesentery. There is a right inguinal hernia which contains a few loops of small bowel. There is no evidence of obstruction. There is a small volume of free fluid within the inguinal hernia. There is also a small volume of free fluid within the pelvis dependently. The appendix is not dilated with no evidence of wall thickening. There is mild inflammatory change in the region of the appendix and cecal tip. Vascular/Lymphatic: Mild  atherosclerotic calcification of the aorta Reproductive:  normal Other: Small volume free fluid. Musculoskeletal: No acute findings IMPRESSION: Findings consistent with sigmoid colon diverticulitis. There is evidence of micro perforation with no evidence of abscess. There is a small volume of free fluid dependently within the pelvis as well as within a right inguinal hernia. The right inguinal hernia contains a few loops of small bowel which show no wall thickening or inflammation in the fluid is likely present within the inguinal hernia as a result of gravity. There is also mild inflammatory change with trace free fluid in the right pericolic gutter near the appendix and cecal tip. The appendix and cecum appear otherwise entirely normal. It is considered unlikely that there is a second focus of inflammation involving either the appendix were the cecum, but more likely that the inflammation and trace free fluid are the result of the diverticulitis elsewhere in the pelvis. Critical Value/emergent results were called by telephone at the time of interpretation on 04/15/2015  at 6:35 pm to Dr. Ezequiel Essex , who verbally acknowledged these results. Electronically Signed   By: Skipper Cliche M.D.   On: 04/15/2015 18:39   I have personally reviewed and evaluated these images and lab results as part of my medical decision-making.   EKG Interpretation None      MDM   Final diagnoses:  Diverticulitis of colon with perforation   Persistently worsening lower abdominal pain since 12 PM yesterday associated with poor appetite. No vomiting, diarrhea, nausea, fever.  Tender periumbilically and right lower quadrant. Leukocytosis. We'll evaluate appendix with CT scan.  CT scan results discussed with Dr. Almyra Free radiology. Sigmoid diverticulitis with microperforation. There is also edema to the right colon near the appendix. The appendix itself appears normal.  We'll treat as diverticulitis with  microperforation. Dr. Clarise Cruz of surgery is currently operating and unavailable. Plan admission to [facility] hospitalist.  Dr. Posey Pronto who accepts patient for admission. Discussed with Dr. Clarise Cruz who agrees with medical management will consult. Patient given Tylenol for fever and additional IV fluids for intra-abdominal sepsis  CRITICAL CARE Performed by: Ezequiel Essex Total critical care time: 35 Critical care time was exclusive of separately billable procedures and treating other patients. Critical care was necessary to treat or prevent imminent or life-threatening deterioration. Critical care was time spent personally by me on the following activities: development of treatment plan with patient and/or surrogate as well as nursing, discussions with consultants, evaluation of patient's response to treatment, examination of patient, obtaining history from patient or surrogate, ordering and performing treatments and interventions, ordering and review of laboratory studies, ordering and review of radiographic studies, pulse oximetry and re-evaluation of patient's condition.    I personally performed the services described in this documentation, which was scribed in my presence. The recorded information has been reviewed and is accurate.   Ezequiel Essex, MD 04/15/15 [phone]

## 2015-04-16 LAB — CBC
HCT: 39.4 % (ref 39.0–52.0)
Hemoglobin: 13 g/dL (ref 13.0–17.0)
MCH: 28.3 pg (ref 26.0–34.0)
MCHC: 33 g/dL (ref 30.0–36.0)
MCV: 85.8 fL (ref 78.0–100.0)
PLATELETS: 157 10*3/uL (ref 150–400)
RBC: 4.59 MIL/uL (ref 4.22–5.81)
RDW: 13.5 % (ref 11.5–15.5)
WBC: 12.5 10*3/uL — AB (ref 4.0–10.5)

## 2015-04-16 LAB — COMPREHENSIVE METABOLIC PANEL
ALK PHOS: 55 U/L (ref 38–126)
ALT: 20 U/L (ref 17–63)
ANION GAP: 5 (ref 5–15)
AST: 21 U/L (ref 15–41)
Albumin: 3.6 g/dL (ref 3.5–5.0)
BILIRUBIN TOTAL: 2 mg/dL — AB (ref 0.3–1.2)
BUN: 14 mg/dL (ref 6–20)
CALCIUM: 8.1 mg/dL — AB (ref 8.9–10.3)
CO2: 26 mmol/L (ref 22–32)
CREATININE: 1.22 mg/dL (ref 0.61–1.24)
Chloride: 105 mmol/L (ref 101–111)
GFR, EST NON AFRICAN AMERICAN: 59 mL/min — AB (ref >60)
Glucose, Bld: 123 mg/dL — ABNORMAL HIGH (ref 65–99)
Potassium: 4.6 mmol/L (ref 3.5–5.1)
Sodium: 136 mmol/L (ref 135–145)
TOTAL PROTEIN: 6.4 g/dL — AB (ref 6.5–8.1)

## 2015-04-16 NOTE — Progress Notes (Signed)
Dennis Diaz is a 69 y.o. male patient. 1. Diverticulitis of colon with perforation    Past Medical History  Diagnosis Date  . Kidney stones   . ED (erectile dysfunction)    Current Facility-Administered Medications  Medication Dose Route Frequency Provider Last Rate Last Dose  . 0.9 %  sodium chloride infusion   Intravenous Continuous Lavina Hamman, MD 100 mL/hr at 04/15/15 2344    . acetaminophen (TYLENOL) tablet 650 mg  650 mg Oral Q6H PRN Lavina Hamman, MD       Or  . acetaminophen (TYLENOL) suppository 650 mg  650 mg Rectal Q6H PRN Lavina Hamman, MD      . antiseptic oral rinse (CPC / CETYLPYRIDINIUM CHLORIDE 0.05%) solution 7 mL  7 mL Mouth Rinse BID Lavina Hamman, MD   7 mL at 04/15/15 2200  . enoxaparin (LOVENOX) injection 40 mg  40 mg Subcutaneous QHS Lavina Hamman, MD   40 mg at 04/15/15 2344  . Influenza vac split quadrivalent PF (FLUARIX) injection 0.5 mL  0.5 mL Intramuscular Tomorrow-1000 Lavina Hamman, MD      . morphine 2 MG/ML injection 2-4 mg  2-4 mg Intravenous Q4H PRN Lavina Hamman, MD   2 mg at 04/15/15 2355  . ondansetron (ZOFRAN) tablet 4 mg  4 mg Oral Q6H PRN Lavina Hamman, MD       Or  . ondansetron [facility]) injection 4 mg  4 mg Intravenous Q6H PRN Lavina Hamman, MD      . oxyCODONE (Oxy IR/ROXICODONE) immediate release tablet 5 mg  5 mg Oral Q4H PRN Lavina Hamman, MD   5 mg at 04/16/15 0403  . piperacillin-tazobactam (ZOSYN) IVPB 3.375 g  3.375 g Intravenous 3 times per day Jackolyn Confer, MD   3.375 g at 04/16/15 0547  . sodium chloride 0.9 % injection 3 mL  3 mL Intravenous Q12H Lavina Hamman, MD   3 mL at 04/15/15 0140   Allergies  Allergen Reactions  . Shellfish Allergy Hives   Principal Problem:   Diverticulitis of intestine with perforation without bleeding  Blood pressure 101/63, pulse 80, temperature 99.4 F (37.4 C), temperature source Oral, resp. rate 18, height 6\' 2"  (1.88 m), weight 102.059 kg (225 lb), SpO2 98 %.  Subjective  This is a 69 y/o male admitted 10/16 for acute sigmoid diverticulitis with microperforation. He was seen at [facility] ED yesterday for severe abdominal cramping, bloating and nausea. CT showed acute sigmoid diverticulitis. Surgery consulted and recommended Zosyn TID and bowel rest. Pt is doing better this morning, states that pain is a 4/10, last oxycodone at 0400. Has not had a BM since admission, last BM on Saturday (10/15).    Objective    Vital signs WNL, temp 99.4. CBC at 0400 - WBC 12.5, otherwise WNL.   PE:  CV: Regular rate and rhythm, no murmurs.  Pulm: Breath sounds clear and equal B/L, no wheezes, rhonchi, rales.  Abdomen: Bowel sounds present and normal in all 4 quadrants. On palpation, abdomen is soft with moderate tenderness in LLQ and RLQ. No rebound tenderness, guarding.  I personally evaluated patient on Assessment & Plan   1. Diverticulitis of colon with perforation  -Patient admitted 10/16 for abdominal pain with acute sigmoid diverticulitis.  -Presently feeling better with decreased pain. WBC 12.5, afebrile.  -Keep NPO, continue IVF  -Continue Zosyn TID and pain meds PRN.  -Consider advancing diet in next couple of days  if symptoms continuing to improve.    Vornheder, Katie PA-S 04/16/2015  Addendum I  I

## 2015-04-16 NOTE — H&P (Signed)
[facility] History and Physical  Patient: Dennis Diaz  MRN: [id number]  DOB: 04/19/1946  DOS: the patient was seen and examined on 04/15/2015 PCP: Criselda Peaches, MD  Referring physician: Dr. Caron Presume Chief Complaint: Abdominal pain  HPI: Dennis Diaz is a 69 y.o. male with Past medical history of renal stones. Patient presents with complaints of abdominal pain. The pain has been located in the lower abdomen and feels like sharp crampy pain. Patient mentions that he was at his baseline when he woke up in the morning but later on after lunch he started having some abdominal discomfort. This was followed by 4-5 episodes of severe abdominal cramps which made him curled up. This patient was significant severe than his prior renal stone pain. He started having some nausea but no vomiting. He did have a bowel movement in the morning it is currently passing gas. He started having some fever as well. No chills. No chest pain or shortness of breath and no cough. He denies any recent travel or recent surgeries. The distal patient presented to the hospital.  The patient is coming from home At his baseline ambulates without support And is independent for most of his ADL; manages his medication on his own.  Review of Systems: as mentioned in the history of present illness.  A comprehensive review of the other systems is negative.  Past Medical History  Diagnosis Date  . Kidney stones   . ED (erectile dysfunction)    Past Surgical History  Procedure Laterality Date  . Root canal      and redo   Social History:  reports that he has never smoked. He has never used smokeless tobacco. He reports that he drinks about 2.4 oz of alcohol per week. He reports that he does not use illicit drugs.  Allergies  Allergen Reactions  . Shellfish Allergy Hives    Family History  Problem Relation Age of Onset  . Alzheimer's disease Mother   . Heart disease Father 64    CABG  .  Diabetes Father     Prior to Admission medications   Medication Sig Start Date End Date Taking? Authorizing Provider  diclofenac (VOLTAREN) 75 MG EC tablet Take 75 mg by mouth 2 (two) times daily as needed (joint pain).  03/20/15  Yes Historical Provider, MD  ibuprofen (ADVIL,MOTRIN) 200 MG tablet Take 400-600 mg by mouth daily as needed for headache.   Yes Historical Provider, MD  Oxygen Permeable Lens Products (BOSTON SIMPLUS) SOLN 1 application by Does not apply route daily. Clean out contacts daily   Yes Historical Provider, MD  VIAGRA 50 MG tablet Take 1 tablet by mouth as needed. 08/01/11  Yes Historical Provider, MD    Physical Exam: Filed Vitals:   04/15/15 2030 04/15/15 2036 04/15/15 2125 04/16/15 0023  BP: 118/69  111/68 103/57  Pulse: 107  93 89  Temp:  102.8 F (39.3 C) 101.2 F (38.4 C) 99.5 F (37.5 C)  TempSrc:   Oral Oral  Resp:    18  Height:      Weight:    102.241 kg (225 lb 6.4 oz)  SpO2: 97%  99% 96%    General: Alert, Awake and Oriented to Time, Place and Person. Appear in mild distress Eyes: PERRL ENT: Oral Mucosa clear moist. Neck: no JVD Cardiovascular: S1 and S2 Present, no Murmur, Peripheral Pulses Present Respiratory: Bilateral Air entry equal and Decreased,  Clear to Auscultation, n Crackles, ono wheezes Abdomen: Bowel Sound  present, Soft and no tenderness Skin: no Rash Extremities: no Pedal edema, no calf tenderness Neurologic: Grossly no focal neuro deficit.  Labs on Admission:  CBC:  Recent Labs Lab 04/15/15 1710  WBC 12.1*  NEUTROABS 10.8*  HGB 15.4  HCT 45.1  MCV 85.1  PLT 199    CMP     Component Value Date/Time   NA 138 04/15/2015 1710   K 4.0 04/15/2015 1710   CL 103 04/15/2015 1710   CO2 26 04/15/2015 1710   GLUCOSE 108* 04/15/2015 1710   BUN 14 04/15/2015 1710   CREATININE 1.04 04/15/2015 1710   CALCIUM 9.0 04/15/2015 1710   PROT 7.7 04/15/2015 1710   ALBUMIN 4.5 04/15/2015 1710   AST 24 04/15/2015 1710   ALT 23  04/15/2015 1710   ALKPHOS 73 04/15/2015 1710   BILITOT 1.1 04/15/2015 1710   GFRNONAA >60 04/15/2015 1710   GFRAA >60 04/15/2015 1710    No results for input(s): CKTOTAL, CKMB, CKMBINDEX, TROPONINI in the last 168 hours. BNP (last 3 results) No results for input(s): BNP in the last 8760 hours.  ProBNP (last 3 results) No results for input(s): PROBNP in the last 8760 hours.   Radiological Exams on Admission: Ct Abdomen Pelvis W Contrast  04/15/2015  CLINICAL DATA:  Right lower quadrant abdominal pain for one day EXAM: CT ABDOMEN AND PELVIS WITH CONTRAST TECHNIQUE: Multidetector CT imaging of the abdomen and pelvis was performed using the standard protocol following bolus administration of intravenous contrast. CONTRAST:  94mL OMNIPAQUE IOHEXOL 300 MG/ML SOLN, 179mL OMNIPAQUE IOHEXOL 300 MG/ML SOLN COMPARISON:  None. FINDINGS: Lower chest:  Normal Hepatobiliary: 7 mm low-attenuation lesion in the dome of the liver on the left consistent with a cyst with average attenuation value of 3 Pancreas: Normal Spleen: Normal Adrenals/Urinary Tract: Adrenal glands are normal. 15 mm upper pole right renal cyst. Left kidney and bladder normal. Stomach/Bowel: There is moderate diverticulosis of the sigmoid colon. There is moderate surrounding inflammatory change. There are a few bubbles of free air in the surrounding mesentery. There is a right inguinal hernia which contains a few loops of small bowel. There is no evidence of obstruction. There is a small volume of free fluid within the inguinal hernia. There is also a small volume of free fluid within the pelvis dependently. The appendix is not dilated with no evidence of wall thickening. There is mild inflammatory change in the region of the appendix and cecal tip. Vascular/Lymphatic: Mild atherosclerotic calcification of the aorta Reproductive:  normal Other: Small volume free fluid. Musculoskeletal: No acute findings IMPRESSION: Findings consistent with  sigmoid colon diverticulitis. There is evidence of micro perforation with no evidence of abscess. There is a small volume of free fluid dependently within the pelvis as well as within a right inguinal hernia. The right inguinal hernia contains a few loops of small bowel which show no wall thickening or inflammation in the fluid is likely present within the inguinal hernia as a result of gravity. There is also mild inflammatory change with trace free fluid in the right pericolic gutter near the appendix and cecal tip. The appendix and cecum appear otherwise entirely normal. It is considered unlikely that there is a second focus of inflammation involving either the appendix were the cecum, but more likely that the inflammation and trace free fluid are the result of the diverticulitis elsewhere in the pelvis. Critical Value/emergent results were called by telephone at the time of interpretation on 04/15/2015 at 6:35 pm to Dr.  Ezequiel Essex , who verbally acknowledged these results. Electronically Signed   By: Skipper Cliche M.D.   On: 04/15/2015 18:39   Assessment/Plan 1. Diverticulitis of intestine with perforation without bleeding The patient is presenting with numbness of abdominal pain. A CT scan of the abdomen was positive for diverticulitis with microperforation. Patient the time of my evaluation does not appear to have any significant abdominal tenderness. With this the patient will be admitted in the hospital. We will continue hydrate him with IV fluids continue with Cipro and Flagyl. Follow the recommendations from general surgery. Nexium patient currently remains nothing by mouth with possible gradual advance to restart in the morning. Remains on Zofran and when necessary morphine.  Nutrition: Nothing by mouth except medications DVT Prophylaxis: subcutaneous Heparin  Advance goals of care discussion: Full code   Consults: Gen. surgery  Family Communication: family was present at bedside,  opportunity was given to ask question and all questions were answered satisfactorily at the time of interview. Disposition: Admitted as inpatient, med-surge unit.  Author: Berle Mull, MD [facility] Pager: [phone] 04/15/2015  If 7PM-7AM, please contact night-coverage [email or web address] Password [email or web address]

## 2015-04-16 NOTE — Progress Notes (Signed)
Follow up note.   I personally evaluated patient on 04/16/2015 and agree with PA student note.  Mr Dorwart is a pleasant 69 year old gentleman with a past medical history of diverticulosis who presented to the emergency department at [facility] on 04/15/2015 with complaints of lower abdominal pain associated with nausea that started the day of admission. He reported subjective fevers, bright red blood per rectum, melena, hematemesis. He had a CT scan of abdomen and pelvis that revealed diverticulitis with evidence of microperforation. There was no evidence of abscess. Gen. surgery was consulted. He was started on empiric IV antibiotic therapy with IV Zosyn.   Patient remains hemodynamically stable, nontoxic appearing. On my exam he was awake and alert, mentating well. MAXIMUM TEMPERATURE overnight 103.1. Blood pressures otherwise stable. On exam his abdomen was soft, with mild generalized tenderness to palpation. No peritoneal signs were evident.   Case discussed with general surgery, will continue empiric IV antibiotic therapy with Zosyn. Keep him nothing by mouth I provide IV fluids. There seems to be clinical improvement.

## 2015-04-16 NOTE — Progress Notes (Signed)
General Surgery Note  LOS: 1 day  POD -     Assessment/Plan: 1.  Diverticulitis with microperf  Zosyn -   WBC up slightly to 12,500 - 04/16/2015  Feels better today.    Agree with NPO, IV antibiotics.  Appears to be improving.  2.  History of kidney stones 3.  Right inguinal hernia on CT scan  I was not very impressed on PE.  4.  DVT prophylaxis - Lovenox   Principal Problem:   Diverticulitis of intestine with perforation without bleeding  Subjective:  Feels better.  Less pain.  This is his first episode.  Wife in room. Objective:   Filed Vitals:   04/16/15 0404  BP: 101/63  Pulse: 80  Temp: 99.4 F (37.4 C)  Resp: 18     Intake/Output from previous day:  10/16 0701 - 10/17 0700 In: 476.7 [I.V.:426.7; IV Piggyback:50] Out: 1400 [Urine:1400]  Intake/Output this shift:  Total I/O In: 50 [IV Piggyback:50] Out: -    Physical Exam:   General: WN tall AM who is alert and oriented.    HEENT: Normal. Pupils equal. .   Lungs: clear   Abdomen: Sore LLQ.  No mass or peritoneal signs.  Has a few BS.   Lab Results:    Recent Labs  04/15/15 1710 04/16/15 0440  WBC 12.1* 12.5*  HGB 15.4 13.0  HCT 45.1 39.4  PLT 199 157    BMET   Recent Labs  04/15/15 1710 04/16/15 0440  NA 138 136  K 4.0 4.6  CL 103 105  CO2 26 26  GLUCOSE 108* 123*  BUN 14 14  CREATININE 1.04 1.22  CALCIUM 9.0 8.1*    PT/INR  No results for input(s): LABPROT, INR in the last 72 hours.  ABG  No results for input(s): PHART, HCO3 in the last 72 hours.  Invalid input(s): PCO2, PO2   Studies/Results:  Ct Abdomen Pelvis W Contrast  04/15/2015  CLINICAL DATA:  Right lower quadrant abdominal pain for one day EXAM: CT ABDOMEN AND PELVIS WITH CONTRAST TECHNIQUE: Multidetector CT imaging of the abdomen and pelvis was performed using the standard protocol following bolus administration of intravenous contrast. CONTRAST:  55mL OMNIPAQUE IOHEXOL 300 MG/ML SOLN, 157mL OMNIPAQUE IOHEXOL 300  MG/ML SOLN COMPARISON:  None. FINDINGS: Lower chest:  Normal Hepatobiliary: 7 mm low-attenuation lesion in the dome of the liver on the left consistent with a cyst with average attenuation value of 3 Pancreas: Normal Spleen: Normal Adrenals/Urinary Tract: Adrenal glands are normal. 15 mm upper pole right renal cyst. Left kidney and bladder normal. Stomach/Bowel: There is moderate diverticulosis of the sigmoid colon. There is moderate surrounding inflammatory change. There are a few bubbles of free air in the surrounding mesentery. There is a right inguinal hernia which contains a few loops of small bowel. There is no evidence of obstruction. There is a small volume of free fluid within the inguinal hernia. There is also a small volume of free fluid within the pelvis dependently. The appendix is not dilated with no evidence of wall thickening. There is mild inflammatory change in the region of the appendix and cecal tip. Vascular/Lymphatic: Mild atherosclerotic calcification of the aorta Reproductive:  normal Other: Small volume free fluid. Musculoskeletal: No acute findings IMPRESSION: Findings consistent with sigmoid colon diverticulitis. There is evidence of micro perforation with no evidence of abscess. There is a small volume of free fluid dependently within the pelvis as well as within a right inguinal hernia. The right inguinal  hernia contains a few loops of small bowel which show no wall thickening or inflammation in the fluid is likely present within the inguinal hernia as a result of gravity. There is also mild inflammatory change with trace free fluid in the right pericolic gutter near the appendix and cecal tip. The appendix and cecum appear otherwise entirely normal. It is considered unlikely that there is a second focus of inflammation involving either the appendix were the cecum, but more likely that the inflammation and trace free fluid are the result of the diverticulitis elsewhere in the pelvis.  Critical Value/emergent results were called by telephone at the time of interpretation on 04/15/2015 at 6:35 pm to Dr. Ezequiel Essex , who verbally acknowledged these results. Electronically Signed   By: Skipper Cliche M.D.   On: 04/15/2015 18:39     Anti-infectives:   Anti-infectives    Start     Dose/Rate Route Frequency Ordered Stop   04/16/15 0600  metroNIDAZOLE (FLAGYL) IVPB 500 mg  Status:  Discontinued     500 mg 100 mL/hr over 60 Minutes Intravenous 3 times per day 04/15/15 2213 04/15/15 2334   04/16/15 0600  ciprofloxacin (CIPRO) IVPB 400 mg  Status:  Discontinued     400 mg 200 mL/hr over 60 Minutes Intravenous Every 12 hours 04/15/15 2225 04/15/15 2334   04/15/15 2345  piperacillin-tazobactam (ZOSYN) IVPB 3.375 g     3.375 g 12.5 mL/hr over 240 Minutes Intravenous 3 times per day 04/15/15 2334     04/15/15 1845  metroNIDAZOLE (FLAGYL) IVPB 500 mg     500 mg 100 mL/hr over 60 Minutes Intravenous  Once 04/15/15 1839 04/15/15 2000   04/15/15 1845  ciprofloxacin (CIPRO) IVPB 400 mg     400 mg 200 mL/hr over 60 Minutes Intravenous  Once 04/15/15 1839 04/15/15 1950      Alphonsa Overall, MD, FACS Pager: [facility] Office: [phone] 04/16/2015

## 2015-04-16 NOTE — Care Management Note (Signed)
Case Management Note  Patient Details  Name: Dennis Diaz MRN: [id number] Date of Birth: 07-24-1945  Subjective/Objective:  69 y/o m admitted w/diverticulitis. From home.Sx following.                  Action/Plan:d/c plan home.   Expected Discharge Date:                  Expected Discharge Plan:  Home/Self Care  In-House Referral:     Discharge planning Services  CM Consult  Post Acute Care Choice:    Choice offered to:     DME Arranged:    DME Agency:     HH Arranged:    HH Agency:     Status of Service:  In process, will continue to follow  Medicare Important Message Given:    Date Medicare IM Given:    Medicare IM give by:    Date Additional Medicare IM Given:    Additional Medicare Important Message give by:     If discussed at [place] of Stay Meetings, dates discussed:    Additional Comments:  Dessa Phi, RN 04/16/2015, 3:19 PM

## 2015-04-16 NOTE — Progress Notes (Signed)
As I was checking off pt orders, I saw that he had an order for cardiac telemetry. I asked PA on call if pt actually needed it, and she said pt did because of his perforated diverticulum. Pt was then transferred to 4th floor telemetry.

## 2015-04-17 LAB — COMPREHENSIVE METABOLIC PANEL
ALBUMIN: 3.4 g/dL — AB (ref 3.5–5.0)
ALT: 17 U/L (ref 17–63)
ANION GAP: 9 (ref 5–15)
AST: 19 U/L (ref 15–41)
Alkaline Phosphatase: 58 U/L (ref 38–126)
BILIRUBIN TOTAL: 1.9 mg/dL — AB (ref 0.3–1.2)
BUN: 13 mg/dL (ref 6–20)
CO2: 23 mmol/L (ref 22–32)
Calcium: 8.3 mg/dL — ABNORMAL LOW (ref 8.9–10.3)
Chloride: 104 mmol/L (ref 101–111)
Creatinine, Ser: 1.18 mg/dL (ref 0.61–1.24)
GFR calc Af Amer: >60 mL/min (ref >60)
Glucose, Bld: 139 mg/dL — ABNORMAL HIGH (ref 65–99)
POTASSIUM: 3.9 mmol/L (ref 3.5–5.1)
Sodium: 136 mmol/L (ref 135–145)
TOTAL PROTEIN: 6.7 g/dL (ref 6.5–8.1)

## 2015-04-17 LAB — CBC
HEMATOCRIT: 45 % (ref 39.0–52.0)
Hemoglobin: 15 g/dL (ref 13.0–17.0)
MCH: 29.1 pg (ref 26.0–34.0)
MCHC: 33.3 g/dL (ref 30.0–36.0)
MCV: 87.2 fL (ref 78.0–100.0)
PLATELETS: 168 10*3/uL (ref 150–400)
RBC: 5.16 MIL/uL (ref 4.22–5.81)
RDW: 13.3 % (ref 11.5–15.5)
WBC: 7.3 10*3/uL (ref 4.0–10.5)

## 2015-04-17 LAB — LACTIC ACID, PLASMA: LACTIC ACID, VENOUS: 1.5 mmol/L (ref 0.5–2.0)

## 2015-04-17 NOTE — Care Management Important Message (Signed)
Important Message  Patient Details  Name: Dennis Diaz MRN: [id number] Date of Birth: 1946-06-08   Medicare Important Message Given:  Yes-second notification given    [name] 04/17/2015, 11:29 [place] Message  Patient Details  Name: Dennis Diaz MRN: [id number] Date of Birth: 1945/07/06   Medicare Important Message Given:  Yes-second notification given    [name] 04/17/2015, 11:29 AM

## 2015-04-17 NOTE — Progress Notes (Signed)
Patient ID: Dennis Diaz, male   DOB: 11-Oct-1945, 69 y.o.   MRN: [id number]     [facility]      [address] [place], [place] [address]    Phone: [phone] FAX: [phone]     Subjective: Watery BM today and had worsening pain.  Fevers.    Objective:  Vital signs:  Filed Vitals:   04/16/15 1421 04/16/15 2131 04/16/15 2331 04/17/15 0451  BP: 120/67 128/71  122/75  Pulse: 78 87  86  Temp: 100.3 F (37.9 C) 101.1 F (38.4 C) 98.5 F (36.9 C) 99.2 F (37.3 C)  TempSrc: Oral Oral Oral Oral  Resp: 18 18  18   Height:      Weight:    102.059 kg (225 lb)  SpO2: 95% 96%  98%    Last BM Date: 04/14/15  Intake/Output   Yesterday:  10/17 0701 - 10/18 0700 In: 2900 [I.V.:2700; IV Piggyback:200] Out: 2176 [Urine:2175; Stool:1] This shift:  Total I/O In: -  Out: 400 [Urine:400]   Physical Exam: General: Pt awake/alert/oriented x4 in no acute distress Abdomen: Soft.  Nondistended. ttp lower abdomen and RUQ.   Problem List:   Principal Problem:   Diverticulitis of intestine with perforation without bleeding    Results:   Labs: Results for orders placed or performed during the hospital encounter of 04/15/15 (from the past 48 hour(s))  CBC with Differential/Platelet     Status: Abnormal   Collection Time: 04/15/15  5:10 PM  Result Value Ref Range   WBC 12.1 (H) 4.0 - 10.5 K/uL   RBC 5.30 4.22 - 5.81 MIL/uL   Hemoglobin 15.4 13.0 - 17.0 g/dL   HCT 45.1 39.0 - 52.0 %   MCV 85.1 78.0 - 100.0 fL   MCH 29.1 26.0 - 34.0 pg   MCHC 34.1 30.0 - 36.0 g/dL   RDW 13.3 11.5 - 15.5 %   Platelets 199 150 - 400 K/uL   Neutrophils Relative % 90 %   Neutro Abs 10.8 (H) 1.7 - 7.7 K/uL   Lymphocytes Relative 5 %   Lymphs Abs 0.6 (L) 0.7 - 4.0 K/uL   Monocytes Relative 5 %   Monocytes Absolute 0.5 0.1 - 1.0 K/uL   Eosinophils Relative 0 %   Eosinophils Absolute 0.0 0.0 - 0.7 K/uL   Basophils Relative 0 %   Basophils  Absolute 0.0 0.0 - 0.1 K/uL  Comprehensive metabolic panel     Status: Abnormal   Collection Time: 04/15/15  5:10 PM  Result Value Ref Range   Sodium 138 135 - 145 mmol/L   Potassium 4.0 3.5 - 5.1 mmol/L   Chloride 103 101 - 111 mmol/L   CO2 26 22 - 32 mmol/L   Glucose, Bld 108 (H) 65 - 99 mg/dL   BUN 14 6 - 20 mg/dL   Creatinine, Ser 1.04 0.61 - 1.24 mg/dL   Calcium 9.0 8.9 - 10.3 mg/dL   Total Protein 7.7 6.5 - 8.1 g/dL   Albumin 4.5 3.5 - 5.0 g/dL   AST 24 15 - 41 U/L   ALT 23 17 - 63 U/L   Alkaline Phosphatase 73 38 - 126 U/L   Total Bilirubin 1.1 0.3 - 1.2 mg/dL   GFR calc non Af Amer >60 >60 mL/min   GFR calc Af Amer >60 >60 mL/min    Comment: (NOTE) The eGFR has been calculated using the CKD EPI equation. This calculation has not been validated  in all clinical situations. eGFR's persistently <60 mL/min signify possible Chronic Kidney Disease.    Anion gap 9 5 - 15  Lipase, blood     Status: None   Collection Time: 04/15/15  5:10 PM  Result Value Ref Range   Lipase 25 22 - 51 U/L  I-Stat CG4 Lactic Acid, ED     Status: None   Collection Time: 04/15/15  5:17 PM  Result Value Ref Range   Lactic Acid, Venous 1.35 0.5 - 2.0 mmol/L  Urinalysis, Routine w reflex microscopic (not at [facility])     Status: Abnormal   Collection Time: 04/15/15  6:03 PM  Result Value Ref Range   Color, Urine YELLOW YELLOW   APPearance CLEAR CLEAR   Specific Gravity, Urine 1.025 1.005 - 1.030   pH 5.0 5.0 - 8.0   Glucose, UA NEGATIVE NEGATIVE mg/dL   Hgb urine dipstick NEGATIVE NEGATIVE   Bilirubin Urine NEGATIVE NEGATIVE   Ketones, ur 40 (A) NEGATIVE mg/dL   Protein, ur NEGATIVE NEGATIVE mg/dL   Urobilinogen, UA 0.2 0.0 - 1.0 mg/dL   Nitrite NEGATIVE NEGATIVE   Leukocytes, UA NEGATIVE NEGATIVE    Comment: MICROSCOPIC NOT DONE ON URINES WITH NEGATIVE PROTEIN, BLOOD, LEUKOCYTES, NITRITE, OR GLUCOSE <1000 mg/dL.  I-Stat CG4 Lactic Acid, ED     Status: None   Collection Time: 04/15/15  8:00 PM   Result Value Ref Range   Lactic Acid, Venous 1.05 0.5 - 2.0 mmol/L  Comprehensive metabolic panel     Status: Abnormal   Collection Time: 04/16/15  4:40 AM  Result Value Ref Range   Sodium 136 135 - 145 mmol/L   Potassium 4.6 3.5 - 5.1 mmol/L   Chloride 105 101 - 111 mmol/L   CO2 26 22 - 32 mmol/L   Glucose, Bld 123 (H) 65 - 99 mg/dL   BUN 14 6 - 20 mg/dL   Creatinine, Ser 1.22 0.61 - 1.24 mg/dL   Calcium 8.1 (L) 8.9 - 10.3 mg/dL   Total Protein 6.4 (L) 6.5 - 8.1 g/dL   Albumin 3.6 3.5 - 5.0 g/dL   AST 21 15 - 41 U/L   ALT 20 17 - 63 U/L   Alkaline Phosphatase 55 38 - 126 U/L   Total Bilirubin 2.0 (H) 0.3 - 1.2 mg/dL   GFR calc non Af Amer 59 (L) >60 mL/min   GFR calc Af Amer >60 >60 mL/min    Comment: (NOTE) The eGFR has been calculated using the CKD EPI equation. This calculation has not been validated in all clinical situations. eGFR's persistently <60 mL/min signify possible Chronic Kidney Disease.    Anion gap 5 5 - 15  CBC     Status: Abnormal   Collection Time: 04/16/15  4:40 AM  Result Value Ref Range   WBC 12.5 (H) 4.0 - 10.5 K/uL   RBC 4.59 4.22 - 5.81 MIL/uL   Hemoglobin 13.0 13.0 - 17.0 g/dL   HCT 39.4 39.0 - 52.0 %   MCV 85.8 78.0 - 100.0 fL   MCH 28.3 26.0 - 34.0 pg   MCHC 33.0 30.0 - 36.0 g/dL   RDW 13.5 11.5 - 15.5 %   Platelets 157 150 - 400 K/uL    Imaging / Studies: Ct Abdomen Pelvis W Contrast  04/15/2015  CLINICAL DATA:  Right lower quadrant abdominal pain for one day EXAM: CT ABDOMEN AND PELVIS WITH CONTRAST TECHNIQUE: Multidetector CT imaging of the abdomen and pelvis was performed using the standard protocol  following bolus administration of intravenous contrast. CONTRAST:  45m OMNIPAQUE IOHEXOL 300 MG/ML SOLN, 1032mOMNIPAQUE IOHEXOL 300 MG/ML SOLN COMPARISON:  None. FINDINGS: Lower chest:  Normal Hepatobiliary: 7 mm low-attenuation lesion in the dome of the liver on the left consistent with a cyst with average attenuation value of 3 Pancreas:  Normal Spleen: Normal Adrenals/Urinary Tract: Adrenal glands are normal. 15 mm upper pole right renal cyst. Left kidney and bladder normal. Stomach/Bowel: There is moderate diverticulosis of the sigmoid colon. There is moderate surrounding inflammatory change. There are a few bubbles of free air in the surrounding mesentery. There is a right inguinal hernia which contains a few loops of small bowel. There is no evidence of obstruction. There is a small volume of free fluid within the inguinal hernia. There is also a small volume of free fluid within the pelvis dependently. The appendix is not dilated with no evidence of wall thickening. There is mild inflammatory change in the region of the appendix and cecal tip. Vascular/Lymphatic: Mild atherosclerotic calcification of the aorta Reproductive:  normal Other: Small volume free fluid. Musculoskeletal: No acute findings IMPRESSION: Findings consistent with sigmoid colon diverticulitis. There is evidence of micro perforation with no evidence of abscess. There is a small volume of free fluid dependently within the pelvis as well as within a right inguinal hernia. The right inguinal hernia contains a few loops of small bowel which show no wall thickening or inflammation in the fluid is likely present within the inguinal hernia as a result of gravity. There is also mild inflammatory change with trace free fluid in the right pericolic gutter near the appendix and cecal tip. The appendix and cecum appear otherwise entirely normal. It is considered unlikely that there is a second focus of inflammation involving either the appendix were the cecum, but more likely that the inflammation and trace free fluid are the result of the diverticulitis elsewhere in the pelvis. Critical Value/emergent results were called by telephone at the time of interpretation on 04/15/2015 at 6:35 pm to Dr. STEzequiel Essex who verbally acknowledged these results. Electronically Signed   By: RaSkipper Cliche.D.   On: 04/15/2015 18:39    Medications / Allergies:  Scheduled Meds: . antiseptic oral rinse  7 mL Mouth Rinse BID  . enoxaparin (LOVENOX) injection  40 mg Subcutaneous QHS  . Influenza vac split quadrivalent PF  0.5 mL Intramuscular Tomorrow-1000  . piperacillin-tazobactam (ZOSYN)  IV  3.375 g Intravenous 3 times per day  . sodium chloride  3 mL Intravenous Q12H   Continuous Infusions: . sodium chloride 100 mL/hr at 04/17/15 0553   PRN Meds:.acetaminophen **OR** acetaminophen, morphine injection, ondansetron **OR** ondansetron (ZOFRAN) IV, oxyCODONE  Antibiotics: Anti-infectives    Start     Dose/Rate Route Frequency Ordered Stop   04/16/15 0600  metroNIDAZOLE (FLAGYL) IVPB 500 mg  Status:  Discontinued     500 mg 100 mL/hr over 60 Minutes Intravenous 3 times per day 04/15/15 2213 04/15/15 2334   04/16/15 0600  ciprofloxacin (CIPRO) IVPB 400 mg  Status:  Discontinued     400 mg 200 mL/hr over 60 Minutes Intravenous Every 12 hours 04/15/15 2225 04/15/15 2334   04/15/15 2345  piperacillin-tazobactam (ZOSYN) IVPB 3.375 g     3.375 g 12.5 mL/hr over 240 Minutes Intravenous 3 times per day 04/15/15 2334     04/15/15 1845  metroNIDAZOLE (FLAGYL) IVPB 500 mg     500 mg 100 mL/hr over 60 Minutes Intravenous  Once 04/15/15  1839 04/15/15 2000   04/15/15 1845  ciprofloxacin (CIPRO) IVPB 400 mg     400 mg 200 mL/hr over 60 Minutes Intravenous  Once 04/15/15 1839 04/15/15 1950        Assessment/Plan Acute sigmoid diverticulitis with microperforation-continue non op management.    WBC normal today.  Will consider clears.   Repeat CT scan in 2-3 days if pain and fevers persist.   -colonoscopy 3/13 Dr. Lorenza Chick and diverticulosis  ID-zosyn D#2. VTE prophylaxis-SCD/lovenox  FEN-NPO, IVF   Erby Pian, ANP-BC [facility] Pager [phone](7A-4:30P)   04/17/2015 otherwise  Agree with above.  Alphonsa Overall, MD, [facility]  Surgery Pager: [phone] Office phone:  [phone]

## 2015-04-17 NOTE — Progress Notes (Signed)
[facility] PROGRESS NOTE  KEENEN ROESSNER QQP:[id number] DOB: [birth year] DOA: 04/15/2015 PCP: Criselda Peaches, MD  Assessment/Plan: 1. Acute Sigmoid Diverticulitis with microperforation  -Patient presented to the ED on 10/16 with abdominal pain and nausea. CT performed at that time demonstrated sigmoid diverticulitis with microperforation but no abscess. -He showed clinical improvement yesterday with minimal pain, nausea.  -Overnight he had a fever of 101.1 and this morning had one episode of severe abdominal cramping with vomiting. Unremarkable abdominal exam at that time. Concern for potential spread of infection vs larger perforation. Surgery was consulted and agreed to continue non-operative management and consider advancing diet to clear liquids. -Repeat CBC today shows WBC down to 7.3 from 12.5 yesterday  -Continue Zosyn, pain management, consider repeat CT scan in 2-3 days if pain and fever continue to persist.   Code Status: Full Code  Family Communication: Wife present at bedside  Disposition Plan:    Consultants:  [facility]  Antibiotics:  Zosyn   HPI/Subjective: Mr. Lia is a 69 y/o male who presented to the ED on 10/16 with complaints of severe abdominal cramping and nausea. CT at that time showed acute sigmoid diverticulitis with microperforation without bleeding or abscess. Lab work showed leukocytosis at 12.1 and elevated total bilirubin. He was admitted for IV abx and pain management, started on Zosyn per general surgery.    Objective: Filed Vitals:   04/17/15 0451  BP: 122/75  Pulse: 86  Temp: 99.2 F (37.3 C)  Resp: 18    Intake/Output Summary (Last 24 hours) at 04/17/15 1356 Last data filed at 04/17/15 1000  Gross per 24 hour  Intake   2850 ml  Output   2326 ml  Net    524 ml   Filed Weights   04/16/15 0023 04/16/15 0404 04/17/15 0451  Weight: 102.241 kg (225 lb 6.4 oz) 102.059 kg (225 lb) 102.059 kg (225 lb)     Exam:   General:  He is slightly diaphoretic appearing but otherwise in no apparent distress.  Cardiovascular: Regular rate and rhythm, no murmurs.   Respiratory: Respirations are unlabored. Breath sounds are clear and equal bilaterally, no wheezes.   Abdomen: Bowel sounds are present in all 4 quadrants. Abdomen is soft with moderate tenderness in LLQ.   Musculoskeletal: No apparent deficits.    Data Reviewed: Basic Metabolic Panel:  Recent Labs Lab 04/15/15 1710 04/16/15 0440 04/17/15 0917  NA 138 136 136  K 4.0 4.6 3.9  CL 103 105 104  CO2 26 26 23   GLUCOSE 108* 123* 139*  BUN 14 14 13   CREATININE 1.04 1.22 1.18  CALCIUM 9.0 8.1* 8.3*   Liver Function Tests:  Recent Labs Lab 04/15/15 1710 04/16/15 0440 04/17/15 0917  AST 24 21 19   ALT 23 20 17   ALKPHOS 73 55 58  BILITOT 1.1 2.0* 1.9*  PROT 7.7 6.4* 6.7  ALBUMIN 4.5 3.6 3.4*    Recent Labs Lab 04/15/15 1710  LIPASE 25   No results for input(s): AMMONIA in the last 168 hours. CBC:  Recent Labs Lab 04/15/15 1710 04/16/15 0440 04/17/15 0917  WBC 12.1* 12.5* 7.3  NEUTROABS 10.8*  --   --   HGB 15.4 13.0 15.0  HCT 45.1 39.4 45.0  MCV 85.1 85.8 87.2  PLT 199 157 168    Studies: Ct Abdomen Pelvis W Contrast  04/15/2015  CLINICAL DATA:  Right lower quadrant abdominal pain for one day EXAM: CT ABDOMEN AND PELVIS WITH CONTRAST TECHNIQUE: Multidetector CT  imaging of the abdomen and pelvis was performed using the standard protocol following bolus administration of intravenous contrast. CONTRAST:  35mL OMNIPAQUE IOHEXOL 300 MG/ML SOLN, 17mL OMNIPAQUE IOHEXOL 300 MG/ML SOLN COMPARISON:  None. FINDINGS: Lower chest:  Normal Hepatobiliary: 7 mm low-attenuation lesion in the dome of the liver on the left consistent with a cyst with average attenuation value of 3 Pancreas: Normal Spleen: Normal Adrenals/Urinary Tract: Adrenal glands are normal. 15 mm upper pole right renal cyst. Left kidney and bladder  normal. Stomach/Bowel: There is moderate diverticulosis of the sigmoid colon. There is moderate surrounding inflammatory change. There are a few bubbles of free air in the surrounding mesentery. There is a right inguinal hernia which contains a few loops of small bowel. There is no evidence of obstruction. There is a small volume of free fluid within the inguinal hernia. There is also a small volume of free fluid within the pelvis dependently. The appendix is not dilated with no evidence of wall thickening. There is mild inflammatory change in the region of the appendix and cecal tip. Vascular/Lymphatic: Mild atherosclerotic calcification of the aorta Reproductive:  normal Other: Small volume free fluid. Musculoskeletal: No acute findings IMPRESSION: Findings consistent with sigmoid colon diverticulitis. There is evidence of micro perforation with no evidence of abscess. There is a small volume of free fluid dependently within the pelvis as well as within a right inguinal hernia. The right inguinal hernia contains a few loops of small bowel which show no wall thickening or inflammation in the fluid is likely present within the inguinal hernia as a result of gravity. There is also mild inflammatory change with trace free fluid in the right pericolic gutter near the appendix and cecal tip. The appendix and cecum appear otherwise entirely normal. It is considered unlikely that there is a second focus of inflammation involving either the appendix were the cecum, but more likely that the inflammation and trace free fluid are the result of the diverticulitis elsewhere in the pelvis. Critical Value/emergent results were called by telephone at the time of interpretation on 04/15/2015 at 6:35 pm to Dr. Roald Lukacs Essex , who verbally acknowledged these results. Electronically Signed   By: Skipper Cliche M.D.   On: 04/15/2015 18:39    Scheduled Meds: . antiseptic oral rinse  7 mL Mouth Rinse BID  . enoxaparin (LOVENOX)  injection  40 mg Subcutaneous QHS  . Influenza vac split quadrivalent PF  0.5 mL Intramuscular Tomorrow-1000  . piperacillin-tazobactam (ZOSYN)  IV  3.375 g Intravenous 3 times per day  . sodium chloride  3 mL Intravenous Q12H   Continuous Infusions: . sodium chloride 100 mL/hr at 04/17/15 4481    Principal Problem:   Diverticulitis of intestine with perforation without bleeding    Time spent: 30 minutes     Vornheder, Katie PA-S  [facility] 04/17/2015, 1:56 PM  LOS: 2 days    Addendum  I personally evaluated patient on 04/17/2015 and agree with the above findings. Mr Donaho is a 69 year old gentleman with a past medical history of diverticulosis, admitted to the medicine service on 04/15/2015 when he presented with complaints of lower abdominal pain associated with nausea. Initial workup included a CT scan of abdomen and pelvis that revealed diverticulitis with evidence of microperforation. Radiology did not report evidence of abscess. Gen. surgery was consulted as he was started on empiric IV antimicrobial therapy with IV Zosyn and managed nonoperatively. This morning he reported having increased abdominal pain associate with nausea and vomiting. Overnight  he spiked a temperature of 101.1. On exam he appeared ill.  AM lab work however resolution of leukocytosis with white count coming down to 7300 from 12,500. On my exam his abdomen was soft having mild tenderness to palpation over the left lower quadrant region. He did not appear to have peritoneal signs. He was awake and alert and mentating well. Rest of his exam was unremarkable. Case discussed with general surgery who recommended continuing nonoperative management. He also recommended advancing his diet to clears today. Consider repeating CT scan in several days if he continues to have fevers.

## 2015-04-18 DIAGNOSIS — K5780 Diverticulitis of intestine, part unspecified, with perforation and abscess without bleeding: Secondary | ICD-10-CM

## 2015-04-18 DIAGNOSIS — R000 Tachycardia, unspecified: Secondary | ICD-10-CM

## 2015-04-18 LAB — BASIC METABOLIC PANEL
Anion gap: 8 (ref 5–15)
BUN: 14 mg/dL (ref 6–20)
CHLORIDE: 104 mmol/L (ref 101–111)
CO2: 24 mmol/L (ref 22–32)
CREATININE: 1.17 mg/dL (ref 0.61–1.24)
Calcium: 8.1 mg/dL — ABNORMAL LOW (ref 8.9–10.3)
GFR calc Af Amer: >60 mL/min (ref >60)
GFR calc non Af Amer: >60 mL/min (ref >60)
Glucose, Bld: 135 mg/dL — ABNORMAL HIGH (ref 65–99)
Potassium: 4 mmol/L (ref 3.5–5.1)
SODIUM: 136 mmol/L (ref 135–145)

## 2015-04-18 LAB — CBC
HCT: 39.8 % (ref 39.0–52.0)
HEMOGLOBIN: 13.4 g/dL (ref 13.0–17.0)
MCH: 29 pg (ref 26.0–34.0)
MCHC: 33.7 g/dL (ref 30.0–36.0)
MCV: 86.1 fL (ref 78.0–100.0)
PLATELETS: 204 10*3/uL (ref 150–400)
RBC: 4.62 MIL/uL (ref 4.22–5.81)
RDW: 13.4 % (ref 11.5–15.5)
WBC: 10.1 10*3/uL (ref 4.0–10.5)

## 2015-04-18 LAB — COMPREHENSIVE METABOLIC PANEL
ALBUMIN: 3 g/dL — AB (ref 3.5–5.0)
ALT: 15 U/L — AB (ref 17–63)
AST: 18 U/L (ref 15–41)
Alkaline Phosphatase: 54 U/L (ref 38–126)
Anion gap: 7 (ref 5–15)
BILIRUBIN TOTAL: 1 mg/dL (ref 0.3–1.2)
BUN: 15 mg/dL (ref 6–20)
CHLORIDE: 105 mmol/L (ref 101–111)
CO2: 26 mmol/L (ref 22–32)
CREATININE: 1.07 mg/dL (ref 0.61–1.24)
Calcium: 8.1 mg/dL — ABNORMAL LOW (ref 8.9–10.3)
GFR calc Af Amer: >60 mL/min (ref >60)
GLUCOSE: 122 mg/dL — AB (ref 65–99)
Potassium: 3.8 mmol/L (ref 3.5–5.1)
Sodium: 138 mmol/L (ref 135–145)
Total Protein: 6.7 g/dL (ref 6.5–8.1)

## 2015-04-18 LAB — C DIFFICILE QUICK SCREEN W PCR REFLEX
C DIFFICILE (CDIFF) INTERP: NEGATIVE
C Diff antigen: NEGATIVE
C Diff toxin: NEGATIVE

## 2015-04-18 NOTE — Progress Notes (Signed)
[facility] PROGRESS NOTE  SAURABH HETTICH JXB:[id number] DOB: 1946/01/07 DOA: 04/15/2015 PCP: Criselda Peaches, MD  Assessment/Plan: 1. Acute Sigmoid Diverticulitis with microperforation  -Patient presented to the ED on 10/16 with abdominal pain and nausea. CT performed at that time demonstrated sigmoid diverticulitis with microperforation but no abscess. -Surgery was consulted and agreed to continue non-operative management and consider advancing diet to clear liquids. -Continue Zosyn, pain management, consider repeat CT scan in 2-3 days if pain and fever continue to persist. -over all improving, no pain, no n/v today on 10/19, watery diarrhea ( c diff negative), seems has stopped  Sinus tachycardia: with frequent pac's and pvc's, seems pain related, will check tsh, continue tele.   Code Status: Full Code  Family Communication: Wife present at bedside  Disposition Plan: home when medically ready   Consultants:  [facility]  Antibiotics:  Zosyn   HPI/Subjective: [name] is a 69 y/o male who presented to the ED on 10/16 with complaints of severe abdominal cramping and nausea. CT at that time showed acute sigmoid diverticulitis with microperforation without bleeding or abscess. Lab work showed leukocytosis at 12.1 and elevated total bilirubin. He was admitted for IV abx and pain management, started on Zosyn per general surgery.    Objective: Filed Vitals:   04/18/15 1451  BP: 129/90  Pulse: 81  Temp: 98.6 F (37 C)  Resp: 18    Intake/Output Summary (Last 24 hours) at 04/18/15 1751 Last data filed at 04/18/15 1500  Gross per 24 hour  Intake   2770 ml  Output   1100 ml  Net   1670 ml   Filed Weights   04/16/15 0404 04/17/15 0451 04/18/15 0456  Weight: 225 lb (102.059 kg) 225 lb (102.059 kg) 227 lb 6.4 oz (103.148 kg)    Exam:   General:  Feeling better, no apparent distress.  Cardiovascular: Regular rate and rhythm, no murmurs.    Respiratory: Respirations are unlabored. Breath sounds are clear and equal bilaterally, no wheezes.   Abdomen: Bowel sounds are present in all 4 quadrants. Abdomen is soft , nontender  Musculoskeletal: No apparent deficits.    Data Reviewed: Basic Metabolic Panel:  Recent Labs Lab 04/15/15 1710 04/16/15 0440 04/17/15 0917 04/18/15 0443 04/18/15 1302  NA 138 136 136 136 138  K 4.0 4.6 3.9 4.0 3.8  CL 103 105 104 104 105  CO2 26 26 23 24 26   GLUCOSE 108* 123* 139* 135* 122*  BUN 14 14 13 14 15   CREATININE 1.04 1.22 1.18 1.17 1.07  CALCIUM 9.0 8.1* 8.3* 8.1* 8.1*   Liver Function Tests:  Recent Labs Lab 04/15/15 1710 04/16/15 0440 04/17/15 0917 04/18/15 1302  AST 24 21 19 18   ALT 23 20 17  15*  ALKPHOS 73 55 58 54  BILITOT 1.1 2.0* 1.9* 1.0  PROT 7.7 6.4* 6.7 6.7  ALBUMIN 4.5 3.6 3.4* 3.0*    Recent Labs Lab 04/15/15 1710  LIPASE 25   No results for input(s): AMMONIA in the last 168 hours. CBC:  Recent Labs Lab 04/15/15 1710 04/16/15 0440 04/17/15 0917 04/18/15 0443  WBC 12.1* 12.5* 7.3 10.1  NEUTROABS 10.8*  --   --   --   HGB 15.4 13.0 15.0 13.4  HCT 45.1 39.4 45.0 39.8  MCV 85.1 85.8 87.2 86.1  PLT 199 157 168 204    Studies: No results found.  Scheduled Meds: . antiseptic oral rinse  7 mL Mouth Rinse BID  . enoxaparin (LOVENOX)  injection  40 mg Subcutaneous QHS  . Influenza vac split quadrivalent PF  0.5 mL Intramuscular Tomorrow-1000  . piperacillin-tazobactam (ZOSYN)  IV  3.375 g Intravenous 3 times per day  . sodium chloride  3 mL Intravenous Q12H   Continuous Infusions: . sodium chloride 100 mL/hr at 04/18/15 1729    Principal Problem:   Diverticulitis of intestine with perforation without bleeding    Time spent: 34 minutes     Pj Zehner MD PhD [phone] [facility] 04/18/2015, 5:51 PM  LOS: 3 days    After 7pm please refer to [employer], password [facility]

## 2015-04-18 NOTE — Progress Notes (Signed)
ANTIBIOTIC CONSULT NOTE - FOLLOW UP  Pharmacy Consult for zosyn Indication: intra-abdominal infection  Allergies  Allergen Reactions  . Shellfish Allergy Hives    Patient Measurements: Height: 6\' 2"  (188 cm) Weight: 227 lb 6.4 oz (103.148 kg) IBW/kg (Calculated) : 82.2  Vital Signs: Temp: 99.2 F (37.3 C) (10/19 0456) Temp Source: Oral (10/19 0456) BP: 124/72 mmHg (10/19 0456) Pulse Rate: 84 (10/19 0456) Intake/Output from previous day: 10/18 0701 - 10/19 0700 In: 1700 [I.V.:1600; IV Piggyback:100] Out: 1600 [Urine:1600] Intake/Output from this shift:    Labs:  Recent Labs  04/16/15 0440 04/17/15 0917 04/18/15 0443  WBC 12.5* 7.3 10.1  HGB 13.0 15.0 13.4  PLT 157 168 204  CREATININE 1.22 1.18 1.17   Estimated Creatinine Clearance: 77.4 mL/min (by C-G formula based on Cr of 1.17). No results for input(s): VANCOTROUGH, VANCOPEAK, VANCORANDOM, GENTTROUGH, GENTPEAK, GENTRANDOM, TOBRATROUGH, TOBRAPEAK, TOBRARND, AMIKACINPEAK, AMIKACINTROU, AMIKACIN in the last 72 hours.   Microbiology: No results found for this or any previous visit (from the past 720 hour(s)).  Anti-infectives    Start     Dose/Rate Route Frequency Ordered Stop   04/16/15 0600  metroNIDAZOLE (FLAGYL) IVPB 500 mg  Status:  Discontinued     500 mg 100 mL/hr over 60 Minutes Intravenous 3 times per day 04/15/15 2213 04/15/15 2334   04/16/15 0600  ciprofloxacin (CIPRO) IVPB 400 mg  Status:  Discontinued     400 mg 200 mL/hr over 60 Minutes Intravenous Every 12 hours 04/15/15 2225 04/15/15 2334   04/15/15 2345  piperacillin-tazobactam (ZOSYN) IVPB 3.375 g     3.375 g 12.5 mL/hr over 240 Minutes Intravenous 3 times per day 04/15/15 2334     04/15/15 1845  metroNIDAZOLE (FLAGYL) IVPB 500 mg     500 mg 100 mL/hr over 60 Minutes Intravenous  Once 04/15/15 1839 04/15/15 2000   04/15/15 1845  ciprofloxacin (CIPRO) IVPB 400 mg     400 mg 200 mL/hr over 60 Minutes Intravenous  Once 04/15/15 1839 04/15/15  1950      Assessment: Patient's a  68 y.o M with acute sigmoid diverticulitis with microperforation currently on zosyn day #3.  Per [facility]CS, no surgical intervention.  Patient remains febrile but wbc trending down to wnl this morning.  Scr 1.17 (crcl~77).  No cultures  Plan:  - continue zosyn 3.375 gm IV q8h (infuse over 4 hours) - with crcl>20, the need for further dosage adjustment appears unlikely at present.  Pharmacy will sign off at this time.  Please reconsult if a change in clinical status warrants re-evaluation of dosage.  Kelvin Sennett P 04/18/2015,9:56 AM

## 2015-04-18 NOTE — Progress Notes (Addendum)
Patient ID: Dennis Diaz, male   DOB: 08-28-1945, 69 y.o.   MRN: [id number]     [facility]      [address] [place], [place] [address]    Phone: [phone] FAX: [phone]     Subjective: Vomited overnight.  Having watery BMs.  Temp yesterday afternoon 102.  BP stable, no tachycardia.  Denies pain.   Objective:  Vital signs:  Filed Vitals:   04/17/15 1417 04/17/15 1530 04/17/15 2104 04/18/15 0456  BP: 127/77  118/78 124/72  Pulse: 106  86 84  Temp: 102.1 F (38.9 C) 100 F (37.8 C) 99.4 F (37.4 C) 99.2 F (37.3 C)  TempSrc: Oral Axillary Oral Oral  Resp: $Remo'20  20 20  'UsrLW$ Height:      Weight:    103.148 kg (227 lb 6.4 oz)  SpO2: 96%  94% 95%    Last BM Date: 04/18/15  Intake/Output   Yesterday:  10/18 0701 - 10/19 0700 In: 1700 [I.V.:1600; IV Piggyback:100] Out: 1600 [Urine:1600] This shift:      Physical Exam: General: Pt awake/alert/oriented x4 in no acute distress Abdomen: Soft. Nondistended. Non tender.    Problem List:   Principal Problem:   Diverticulitis of intestine with perforation without bleeding    Results:   Labs: Results for orders placed or performed during the hospital encounter of 04/15/15 (from the past 48 hour(s))  CBC     Status: None   Collection Time: 04/17/15  9:17 AM  Result Value Ref Range   WBC 7.3 4.0 - 10.5 K/uL   RBC 5.16 4.22 - 5.81 MIL/uL   Hemoglobin 15.0 13.0 - 17.0 g/dL   HCT 45.0 39.0 - 52.0 %   MCV 87.2 78.0 - 100.0 fL   MCH 29.1 26.0 - 34.0 pg   MCHC 33.3 30.0 - 36.0 g/dL   RDW 13.3 11.5 - 15.5 %   Platelets 168 150 - 400 K/uL  Comprehensive metabolic panel     Status: Abnormal   Collection Time: 04/17/15  9:17 AM  Result Value Ref Range   Sodium 136 135 - 145 mmol/L   Potassium 3.9 3.5 - 5.1 mmol/L   Chloride 104 101 - 111 mmol/L   CO2 23 22 - 32 mmol/L   Glucose, Bld 139 (H) 65 - 99 mg/dL   BUN 13 6 - 20 mg/dL   Creatinine, Ser 1.18 0.61 - 1.24 mg/dL    Calcium 8.3 (L) 8.9 - 10.3 mg/dL   Total Protein 6.7 6.5 - 8.1 g/dL   Albumin 3.4 (L) 3.5 - 5.0 g/dL   AST 19 15 - 41 U/L   ALT 17 17 - 63 U/L   Alkaline Phosphatase 58 38 - 126 U/L   Total Bilirubin 1.9 (H) 0.3 - 1.2 mg/dL   GFR calc non Af Amer >60 >60 mL/min   GFR calc Af Amer >60 >60 mL/min    Comment: (NOTE) The eGFR has been calculated using the CKD EPI equation. This calculation has not been validated in all clinical situations. eGFR's persistently <60 mL/min signify possible Chronic Kidney Disease.    Anion gap 9 5 - 15  Lactic acid, plasma     Status: None   Collection Time: 04/17/15  9:17 AM  Result Value Ref Range   Lactic Acid, Venous 1.5 0.5 - 2.0 mmol/L  CBC     Status: None   Collection Time: 04/18/15  4:43 AM  Result Value Ref Range  WBC 10.1 4.0 - 10.5 K/uL   RBC 4.62 4.22 - 5.81 MIL/uL   Hemoglobin 13.4 13.0 - 17.0 g/dL   HCT 52.5 06.0 - 49.3 %   MCV 86.1 78.0 - 100.0 fL   MCH 29.0 26.0 - 34.0 pg   MCHC 33.7 30.0 - 36.0 g/dL   RDW 31.9 91.9 - 06.0 %   Platelets 204 150 - 400 K/uL  Basic metabolic panel     Status: Abnormal   Collection Time: 04/18/15  4:43 AM  Result Value Ref Range   Sodium 136 135 - 145 mmol/L   Potassium 4.0 3.5 - 5.1 mmol/L   Chloride 104 101 - 111 mmol/L   CO2 24 22 - 32 mmol/L   Glucose, Bld 135 (H) 65 - 99 mg/dL   BUN 14 6 - 20 mg/dL   Creatinine, Ser 7.79 0.61 - 1.24 mg/dL   Calcium 8.1 (L) 8.9 - 10.3 mg/dL   GFR calc non Af Amer >60 >60 mL/min   GFR calc Af Amer >60 >60 mL/min    Comment: (NOTE) The eGFR has been calculated using the CKD EPI equation. This calculation has not been validated in all clinical situations. eGFR's persistently <60 mL/min signify possible Chronic Kidney Disease.    Anion gap 8 5 - 15    Imaging / Studies: No results found.  Medications / Allergies:  Scheduled Meds: . antiseptic oral rinse  7 mL Mouth Rinse BID  . enoxaparin (LOVENOX) injection  40 mg Subcutaneous QHS  . Influenza  vac split quadrivalent PF  0.5 mL Intramuscular Tomorrow-1000  . piperacillin-tazobactam (ZOSYN)  IV  3.375 g Intravenous 3 times per day  . sodium chloride  3 mL Intravenous Q12H   Continuous Infusions: . sodium chloride 100 mL/hr at 04/18/15 0715   PRN Meds:.acetaminophen **OR** acetaminophen, morphine injection, ondansetron **OR** ondansetron (ZOFRAN) IV, oxyCODONE  Antibiotics: Anti-infectives    Start     Dose/Rate Route Frequency Ordered Stop   04/16/15 0600  metroNIDAZOLE (FLAGYL) IVPB 500 mg  Status:  Discontinued     500 mg 100 mL/hr over 60 Minutes Intravenous 3 times per day 04/15/15 2213 04/15/15 2334   04/16/15 0600  ciprofloxacin (CIPRO) IVPB 400 mg  Status:  Discontinued     400 mg 200 mL/hr over 60 Minutes Intravenous Every 12 hours 04/15/15 2225 04/15/15 2334   04/15/15 2345  piperacillin-tazobactam (ZOSYN) IVPB 3.375 g     3.375 g 12.5 mL/hr over 240 Minutes Intravenous 3 times per day 04/15/15 2334     04/15/15 1845  metroNIDAZOLE (FLAGYL) IVPB 500 mg     500 mg 100 mL/hr over 60 Minutes Intravenous  Once 04/15/15 1839 04/15/15 2000   04/15/15 1845  ciprofloxacin (CIPRO) IVPB 400 mg     400 mg 200 mL/hr over 60 Minutes Intravenous  Once 04/15/15 1839 04/15/15 1950      Assessment/Plan Acute sigmoid diverticulitis with microperforation-continue non op management.  non tender.  WBC normal.  T max 102.1.   Repeat CT scan in 1-2 days if  fevers persist.  Colonoscopy 3/13 Dr. Dorie Rank and diverticulosis  ID-zosyn D#3. CBC in AM VTE prophylaxis-SCD/lovenox  FEN-clears, IVF  Ashok Norris, ANP-BC [facility] Pager [phone](7A-4:30P)   04/18/2015  Agree with above. He said that today has been his best day so far.  He has taken small amounts of clear liquids.  He has had some loose stool.  Ovidio Kin, MD, [facility] Pager: [phone] Office phone:  [phone]

## 2015-04-19 ENCOUNTER — Inpatient Hospital Stay (HOSPITAL_COMMUNITY): Payer: Medicare Other

## 2015-04-19 LAB — URINALYSIS, ROUTINE W REFLEX MICROSCOPIC
GLUCOSE, UA: NEGATIVE mg/dL
Ketones, ur: 15 mg/dL — AB
NITRITE: NEGATIVE
PROTEIN: 100 mg/dL — AB
Specific Gravity, Urine: 1.028 (ref 1.005–1.030)
Urobilinogen, UA: 0.2 mg/dL (ref 0.0–1.0)
pH: 5.5 (ref 5.0–8.0)

## 2015-04-19 LAB — URINE MICROSCOPIC-ADD ON

## 2015-04-19 LAB — BASIC METABOLIC PANEL
ANION GAP: 9 (ref 5–15)
BUN: 13 mg/dL (ref 6–20)
CALCIUM: 8 mg/dL — AB (ref 8.9–10.3)
CHLORIDE: 105 mmol/L (ref 101–111)
CO2: 25 mmol/L (ref 22–32)
Creatinine, Ser: 1.06 mg/dL (ref 0.61–1.24)
GFR calc Af Amer: >60 mL/min (ref >60)
GFR calc non Af Amer: >60 mL/min (ref >60)
GLUCOSE: 116 mg/dL — AB (ref 65–99)
Potassium: 3.5 mmol/L (ref 3.5–5.1)
Sodium: 139 mmol/L (ref 135–145)

## 2015-04-19 LAB — CBC
HCT: 37.8 % — ABNORMAL LOW (ref 39.0–52.0)
Hemoglobin: 12.8 g/dL — ABNORMAL LOW (ref 13.0–17.0)
MCH: 28.9 pg (ref 26.0–34.0)
MCHC: 33.9 g/dL (ref 30.0–36.0)
MCV: 85.3 fL (ref 78.0–100.0)
PLATELETS: 230 10*3/uL (ref 150–400)
RBC: 4.43 MIL/uL (ref 4.22–5.81)
RDW: 13.4 % (ref 11.5–15.5)
WBC: 9.6 10*3/uL (ref 4.0–10.5)

## 2015-04-19 LAB — MAGNESIUM: Magnesium: 2.1 mg/dL (ref 1.7–2.4)

## 2015-04-19 LAB — TSH: TSH: 3.722 u[IU]/mL (ref 0.350–4.500)

## 2015-04-19 LAB — LACTIC ACID, PLASMA: Lactic Acid, Venous: 1.7 mmol/L (ref 0.5–2.0)

## 2015-04-19 NOTE — Care Management Note (Signed)
Case Management Note  Patient Details  Name: Dennis Diaz MRN: [id number] Date of Birth: 13-Jun-1946  Subjective/Objective:  Diverticulitis.sx following. ivf,Iv abx,clears.                 Action/Plan:d/c plan home.   Expected Discharge Date:                  Expected Discharge Plan:  Home/Self Care  In-House Referral:     Discharge planning Services  CM Consult  Post Acute Care Choice:    Choice offered to:     DME Arranged:    DME Agency:     HH Arranged:    HH Agency:     Status of Service:  In process, will continue to follow  Medicare Important Message Given:  Yes-second notification given Date Medicare IM Given:    Medicare IM give by:    Date Additional Medicare IM Given:    Additional Medicare Important Message give by:     If discussed at [place] of Stay Meetings, dates discussed:    Additional Comments:  Dessa Phi, RN 04/19/2015, 3:25 PM

## 2015-04-19 NOTE — Progress Notes (Signed)
  Subjective: He is better, but had bloating and additional pain requiring Morphine shot after supper. He is going real slow this Am.  Tenderness midlower abdomen.   Objective: Vital signs in last 24 hours: Temp:  [98.6 F (37 C)-102.7 F (39.3 C)] 99.7 F (37.6 C) (10/20 0514) Pulse Rate:  [79-105] 79 (10/20 0514) Resp:  [18-19] 18 (10/20 0514) BP: (127-149)/(68-90) 127/68 mmHg (10/20 0514) SpO2:  [94 %-96 %] 94 % (10/20 0514) Weight:  [103.874 kg (229 lb)] 103.874 kg (229 lb) (10/20 0514) Last BM Date: 04/18/15 240 PO yesterday recorded  Diet: clears BM x 3  Afebrile some tachycardia, BP OK Labs OK CT 04/15/15 Intake/Output from previous day: 10/19 0701 - 10/20 0700 In: 2790 [P.O.:240; I.V.:2400; IV Piggyback:150] Out: 975 [Urine:975] Intake/Output this shift: Total I/O In: 120 [P.O.:120] Out: -   General appearance: alert, cooperative and no distress GI: soft, still somewhat distened, sore more than tender mid lower abdomen.  Lab Results:   Recent Labs  04/18/15 0443 04/19/15 0604  WBC 10.1 9.6  HGB 13.4 12.8*  HCT 39.8 37.8*  PLT 204 230    BMET  Recent Labs  04/18/15 1302 04/19/15 0604  NA 138 139  K 3.8 3.5  CL 105 105  CO2 26 25  GLUCOSE 122* 116*  BUN 15 13  CREATININE 1.07 1.06  CALCIUM 8.1* 8.0*   PT/INR No results for input(s): LABPROT, INR in the last 72 hours.   Recent Labs Lab 04/15/15 1710 04/16/15 0440 04/17/15 0917 04/18/15 1302  AST 24 21 19 18   ALT 23 20 17  15*  ALKPHOS 73 55 58 54  BILITOT 1.1 2.0* 1.9* 1.0  PROT 7.7 6.4* 6.7 6.7  ALBUMIN 4.5 3.6 3.4* 3.0*     Lipase     Component Value Date/Time   LIPASE 25 04/15/2015 1710     Studies/Results: No results found.  Medications: . antiseptic oral rinse  7 mL Mouth Rinse BID  . enoxaparin (LOVENOX) injection  40 mg Subcutaneous QHS  . Influenza vac split quadrivalent PF  0.5 mL Intramuscular Tomorrow-1000  . piperacillin-tazobactam (ZOSYN)  IV  3.375 g  Intravenous 3 times per day  . sodium chloride  3 mL Intravenous Q12H    Assessment/Plan Acute sigmoid diverticulitis with microperforation ID-zosyn D#5 VTE prophylaxis-SCD/lovenox  FEN-NPO, IVF  CXR today shows left lung infiltrate/atelectasis.    Plan:  Continue antibiotics and clears for now.  CT 10 /16, will discuss when to repeat CT.  WBC is normal right now.     LOS: 4 days    JENNINGS,WILLARD 04/19/2015  Agree with above.  Alphonsa Overall, MD, [facility] Pager: [phone] Office phone:  [phone]

## 2015-04-19 NOTE — Progress Notes (Signed)
MD paged with results of CxR. Question of developing infiltrate or atelectasis at the left lung base. 2. Followup is recommended. A PA and lateral chest x-ray is recommended if the patient is able to tolerate the exam.  UA sent, Blood cultures obtained. Temp 100.8, will continue to monitor.  Barbee Shropshire. Brigitte Pulse, RN

## 2015-04-19 NOTE — Progress Notes (Addendum)
[facility] PROGRESS NOTE  Dennis Diaz XLK:[id number] DOB: September 06, 1945 DOA: 04/15/2015 PCP: Criselda Peaches, MD  Assessment/Plan: 1. Acute Sigmoid Diverticulitis with microperforation  -Patient presented to the ED on 10/16 with abdominal pain and nausea. CT performed at that time demonstrated sigmoid diverticulitis with microperforation but no abscess. -Surgery was consulted and agreed to continue non-operative management and consider advancing diet to clear liquids. -Continue Zosyn, pain management, consider repeat CT scan in 2-3 days if pain and fever continue to persist. -over all improving, no pain, no n/v today on 10/19, watery diarrhea ( c diff negative), seems has stopped -spike fever again, reported pain with clears, feeling good this am, surgery to decide repeat ct ab, continue abx/ clears  Keep spiking fever, will repeat ua, check blood culture, cxr, discussed with surgery pa Will over the phone, will continue clears as tolerated, repeat CT ab /pelv to be determined by general surgery.  Sinus tachycardia: with frequent pac's and pvc's, seems pain related,  tsh wnl, continue tele.  Addendum 5pm, cxr 2 view consistent with mild pleural effusion, d/c ivf.   Code Status: Full Code  Family Communication: Wife present at bedside  Disposition Plan: home when medically ready   Consultants:  [facility]  Antibiotics:  Zosyn   HPI/Subjective: Continue spiking fever, and pain with eating, feeling fine this am, wife in room   Objective: Filed Vitals:   04/19/15 0514  BP: 127/68  Pulse: 79  Temp: 99.7 F (37.6 C)  Resp: 18    Intake/Output Summary (Last 24 hours) at 04/19/15 1049 Last data filed at 04/19/15 0900  Gross per 24 hour  Intake   2910 ml  Output    975 ml  Net   1935 ml   Filed Weights   04/17/15 0451 04/18/15 0456 04/19/15 0514  Weight: 225 lb (102.059 kg) 227 lb 6.4 oz (103.148 kg) 229 lb (103.874 kg)    Exam:   General:   Feeling better, no apparent distress.  Cardiovascular: Regular rate and rhythm, no murmurs.   Respiratory: Respirations are unlabored. Breath sounds are clear and equal bilaterally, no wheezes.   Abdomen: Bowel sounds are present in all 4 quadrants. Abdomen is soft , nontender  Musculoskeletal: No apparent deficits.    Data Reviewed: Basic Metabolic Panel:  Recent Labs Lab 04/16/15 0440 04/17/15 0917 04/18/15 0443 04/18/15 1302 04/19/15 0604  NA 136 136 136 138 139  K 4.6 3.9 4.0 3.8 3.5  CL 105 104 104 105 105  CO2 26 23 24 26 25   GLUCOSE 123* 139* 135* 122* 116*  BUN 14 13 14 15 13   CREATININE 1.22 1.18 1.17 1.07 1.06  CALCIUM 8.1* 8.3* 8.1* 8.1* 8.0*  MG  --   --   --   --  2.1   Liver Function Tests:  Recent Labs Lab 04/15/15 1710 04/16/15 0440 04/17/15 0917 04/18/15 1302  AST 24 21 19 18   ALT 23 20 17  15*  ALKPHOS 73 55 58 54  BILITOT 1.1 2.0* 1.9* 1.0  PROT 7.7 6.4* 6.7 6.7  ALBUMIN 4.5 3.6 3.4* 3.0*    Recent Labs Lab 04/15/15 1710  LIPASE 25   No results for input(s): AMMONIA in the last 168 hours. CBC:  Recent Labs Lab 04/15/15 1710 04/16/15 0440 04/17/15 0917 04/18/15 0443 04/19/15 0604  WBC 12.1* 12.5* 7.3 10.1 9.6  NEUTROABS 10.8*  --   --   --   --   HGB 15.4 13.0 15.0 13.4 12.8*  HCT 45.1 39.4 45.0 39.8 37.8*  MCV 85.1 85.8 87.2 86.1 85.3  PLT 199 157 168 204 230    Studies: No results found.  Scheduled Meds: . antiseptic oral rinse  7 mL Mouth Rinse BID  . enoxaparin (LOVENOX) injection  40 mg Subcutaneous QHS  . Influenza vac split quadrivalent PF  0.5 mL Intramuscular Tomorrow-1000  . piperacillin-tazobactam (ZOSYN)  IV  3.375 g Intravenous 3 times per day  . sodium chloride  3 mL Intravenous Q12H   Continuous Infusions: . sodium chloride 100 mL/hr at 04/19/15 0623    Principal Problem:   Diverticulitis of intestine with perforation without bleeding    Time spent: 71 minutes     Jemmie Ledgerwood MD PhD [phone] [facility] 04/19/2015, 10:49 AM  LOS: 4 days    After 7pm please refer to [employer], password [facility]

## 2015-04-20 DIAGNOSIS — R509 Fever, unspecified: Secondary | ICD-10-CM

## 2015-04-20 LAB — BASIC METABOLIC PANEL
ANION GAP: 8 (ref 5–15)
BUN: 12 mg/dL (ref 6–20)
CO2: 23 mmol/L (ref 22–32)
Calcium: 7.8 mg/dL — ABNORMAL LOW (ref 8.9–10.3)
Chloride: 105 mmol/L (ref 101–111)
Creatinine, Ser: 0.95 mg/dL (ref 0.61–1.24)
GFR calc Af Amer: >60 mL/min (ref >60)
GFR calc non Af Amer: >60 mL/min (ref >60)
GLUCOSE: 107 mg/dL — AB (ref 65–99)
POTASSIUM: 3.4 mmol/L — AB (ref 3.5–5.1)
Sodium: 136 mmol/L (ref 135–145)

## 2015-04-20 LAB — CBC
HEMATOCRIT: 36.6 % — AB (ref 39.0–52.0)
Hemoglobin: 12.4 g/dL — ABNORMAL LOW (ref 13.0–17.0)
MCH: 29 pg (ref 26.0–34.0)
MCHC: 33.9 g/dL (ref 30.0–36.0)
MCV: 85.5 fL (ref 78.0–100.0)
Platelets: 255 10*3/uL (ref 150–400)
RBC: 4.28 MIL/uL (ref 4.22–5.81)
RDW: 13.7 % (ref 11.5–15.5)
WBC: 10.5 10*3/uL (ref 4.0–10.5)

## 2015-04-20 LAB — CBC WITH DIFFERENTIAL/PLATELET
BASOS ABS: 0 10*3/uL (ref 0.0–0.1)
Basophils Relative: 0 %
EOS PCT: 4 %
Eosinophils Absolute: 0.5 10*3/uL (ref 0.0–0.7)
HEMATOCRIT: 35.6 % — AB (ref 39.0–52.0)
HEMOGLOBIN: 12.1 g/dL — AB (ref 13.0–17.0)
LYMPHS ABS: 1.2 10*3/uL (ref 0.7–4.0)
LYMPHS PCT: 11 %
MCH: 28.9 pg (ref 26.0–34.0)
MCHC: 34 g/dL (ref 30.0–36.0)
MCV: 85.2 fL (ref 78.0–100.0)
Monocytes Absolute: 0.8 10*3/uL (ref 0.1–1.0)
Monocytes Relative: 7 %
NEUTROS ABS: 9 10*3/uL — AB (ref 1.7–7.7)
Neutrophils Relative %: 78 %
PLATELETS: 279 10*3/uL (ref 150–400)
RBC: 4.18 MIL/uL — AB (ref 4.22–5.81)
RDW: 13.6 % (ref 11.5–15.5)
WBC: 11.4 10*3/uL — AB (ref 4.0–10.5)

## 2015-04-20 LAB — HEMOGLOBIN A1C
Hgb A1c MFr Bld: 5.8 % — ABNORMAL HIGH (ref 4.8–5.6)
Mean Plasma Glucose: 120 mg/dL

## 2015-04-20 LAB — LACTIC ACID, PLASMA: Lactic Acid, Venous: 1.1 mmol/L (ref 0.5–2.0)

## 2015-04-20 MED ORDER — POTASSIUM CHLORIDE CRYS ER 20 MEQ PO TBCR
20.0000 meq | EXTENDED_RELEASE_TABLET | Freq: Two times a day (BID) | ORAL | Status: DC
  Administered 2015-04-20 – 2015-04-21 (×4): 20 meq via ORAL
  Filled 2015-04-20 (×3): qty 1

## 2015-04-20 NOTE — Progress Notes (Signed)
Subjective: He seems to be doing better this AM, no further issues with bloating.  No pain.    Objective: Vital signs in last 24 hours: Temp:  [99 F (37.2 C)-101.3 F (38.5 C)] 99 F (37.2 C) (10/21 0547) Pulse Rate:  [77-96] 91 (10/21 0547) Resp:  [16-18] 16 (10/21 0547) BP: (127-140)/(74-76) 127/74 mmHg (10/21 0547) SpO2:  [95 %-98 %] 96 % (10/21 0547) Weight:  [103.465 kg (228 lb 1.6 oz)] 103.465 kg (228 lb 1.6 oz) (10/20 2053) Last BM Date: 04/18/15 360 PO recorded Voided x 4 with 570 volume recorded ?? Temp up yesterday 100.9 and 101.3, then back down K 3.4 WBC 10.5 Intake/Output from previous day: 10/20 0701 - 10/21 0700 In: 1360 [P.O.:360; I.V.:800; IV Piggyback:200] Out: 570 [Urine:570] Intake/Output this shift:    General appearance: alert, cooperative and no distress GI: large distended abdomen up in chair, but no pain or tenderness,  he feels comfortable, + BS  Lab Results:   Recent Labs  04/19/15 0604 04/20/15 0436  WBC 9.6 10.5  HGB 12.8* 12.4*  HCT 37.8* 36.6*  PLT 230 255    BMET  Recent Labs  04/19/15 0604 04/20/15 0436  NA 139 136  K 3.5 3.4*  CL 105 105  CO2 25 23  GLUCOSE 116* 107*  BUN 13 12  CREATININE 1.06 0.95  CALCIUM 8.0* 7.8*   PT/INR No results for input(s): LABPROT, INR in the last 72 hours.   Recent Labs Lab 04/15/15 1710 04/16/15 0440 04/17/15 0917 04/18/15 1302  AST 24 21 19 18   ALT 23 20 17  15*  ALKPHOS 73 55 58 54  BILITOT 1.1 2.0* 1.9* 1.0  PROT 7.7 6.4* 6.7 6.7  ALBUMIN 4.5 3.6 3.4* 3.0*     Lipase     Component Value Date/Time   LIPASE 25 04/15/2015 1710     Studies/Results: Dg Chest 1 View  04/19/2015  CLINICAL DATA:  Fever and abdominal pain. History of diverticulitis. EXAM: CHEST 1 VIEW COMPARISON:  None. FINDINGS: Heart size is normal. There is elevation of the right hemidiaphragm. Focal density at the left lung base may represent atelectasis and was not identified on the recent abdomen  and pelvic CT. There is no pulmonary edema. IMPRESSION: 1. Question of developing infiltrate or atelectasis at the left lung base. 2. Followup is recommended. A PA and lateral chest x-ray is recommended if the patient is able to tolerate the exam. Electronically Signed   By: Nolon Nations M.D.   On: 04/19/2015 15:01   Dg Chest 2 View  04/19/2015  CLINICAL DATA:  Followup prior AP chest x-ray with possible basilar infiltrates. EXAM: CHEST  2 VIEW COMPARISON:  Chest x-ray 04/19/2015 and CT scan of the abdomen 04/15/2015 FINDINGS: The cardiac silhouette, mediastinal and hilar contours are within normal limits and stable. There are bibasilar infiltrates and a the possible right pleural effusion. The bony thorax is intact. Air-filled small bowel loops are noted in the upper central abdomen IMPRESSION: Bibasilar infiltrates. Electronically Signed   By: Marijo Sanes M.D.   On: 04/19/2015 16:33    Medications: . antiseptic oral rinse  7 mL Mouth Rinse BID  . enoxaparin (LOVENOX) injection  40 mg Subcutaneous QHS  . Influenza vac split quadrivalent PF  0.5 mL Intramuscular Tomorrow-1000  . piperacillin-tazobactam (ZOSYN)  IV  3.375 g Intravenous 3 times per day  . sodium chloride  3 mL Intravenous Q12H    Assessment/Plan Acute sigmoid diverticulitis with microperforation ID-zosyn D#5 VTE prophylaxis-SCD/lovenox  FEN- Clears, IVF   Plan:  Advance to full liquids, not sure why he had fever yesterday.  Labs look good this AM, add some K+.    LOS: 5 days    JENNINGS,WILLARD 04/20/2015  Agree with above. He looks good.  To advance diet.  Alphonsa Overall, MD, [facility] Pager: [phone] Office phone:  [phone]

## 2015-04-20 NOTE — Care Management Important Message (Signed)
Important Message  Patient Details  Name: ELDIN BONSELL MRN: [id number] Date of Birth: October 08, 1945   Medicare Important Message Given:  Yes-third notification given    [name] 04/20/2015, 10:02 AMImportant Message  Patient Details  Name: JOSMAR MESSIMER MRN: [id number] Date of Birth: [birth year]   Medicare Important Message Given:  Yes-third notification given    [name] 04/20/2015, 10:02 AM

## 2015-04-20 NOTE — Progress Notes (Signed)
[facility] PROGRESS NOTE  Dennis Diaz QIW:[id number] DOB: 07-31-1945 DOA: 04/15/2015 PCP: Criselda Peaches, MD  Assessment/Plan: 1. Acute Sigmoid Diverticulitis with microperforation  -Patient presented to the ED on 10/16 with abdominal pain and nausea. CT performed at that time demonstrated sigmoid diverticulitis with microperforation but no abscess. -Surgery was consulted and agreed to continue non-operative management and consider advancing diet to clear liquids. -Continue Zosyn, pain management, consider repeat CT scan in 2-3 days if pain and fever continue to persist. -over all improving, no pain, no n/v today, watery diarrhea ( c diff negative),   2. Fever: likely from #1 repeat ua unremarkable,  blood culture no growth, cxr , mild pleural effusion, ivf d/ced.  3. Sinus tachycardia: with frequent pac's and pvc's, seems pain related,  tsh wnl, continue tele. Better.    Code Status: Full Code  Family Communication: patient Disposition Plan: home when medically ready   Consultants:  [facility]  Antibiotics:  Zosyn   HPI/Subjective: Last fever 6pm yesterday, denies pain, no n/v, continued watery diarrhea, c diff negative   Objective: Filed Vitals:   04/20/15 1350  BP: 139/73  Pulse: 78  Temp: 99.1 F (37.3 C)  Resp: 17    Intake/Output Summary (Last 24 hours) at 04/20/15 1457 Last data filed at 04/20/15 0547  Gross per 24 hour  Intake   1240 ml  Output    220 ml  Net   1020 ml   Filed Weights   04/18/15 0456 04/19/15 0514 04/19/15 2053  Weight: 227 lb 6.4 oz (103.148 kg) 229 lb (103.874 kg) 228 lb 1.6 oz (103.465 kg)    Exam:   General:  Feeling better, no apparent distress.  Cardiovascular: Regular rate and rhythm, no murmurs.   Respiratory: Respirations are unlabored. Breath sounds are clear and equal bilaterally, no wheezes.   Abdomen: Bowel sounds are present in all 4 quadrants. Abdomen is soft ,  nontender  Musculoskeletal: No apparent deficits.    Data Reviewed: Basic Metabolic Panel:  Recent Labs Lab 04/17/15 0917 04/18/15 0443 04/18/15 1302 04/19/15 0604 04/20/15 0436  NA 136 136 138 139 136  K 3.9 4.0 3.8 3.5 3.4*  CL 104 104 105 105 105  CO2 23 24 26 25 23   GLUCOSE 139* 135* 122* 116* 107*  BUN 13 14 15 13 12   CREATININE 1.18 1.17 1.07 1.06 0.95  CALCIUM 8.3* 8.1* 8.1* 8.0* 7.8*  MG  --   --   --  2.1  --    Liver Function Tests:  Recent Labs Lab 04/15/15 1710 04/16/15 0440 04/17/15 0917 04/18/15 1302  AST 24 21 19 18   ALT 23 20 17  15*  ALKPHOS 73 55 58 54  BILITOT 1.1 2.0* 1.9* 1.0  PROT 7.7 6.4* 6.7 6.7  ALBUMIN 4.5 3.6 3.4* 3.0*    Recent Labs Lab 04/15/15 1710  LIPASE 25   No results for input(s): AMMONIA in the last 168 hours. CBC:  Recent Labs Lab 04/15/15 1710 04/16/15 0440 04/17/15 0917 04/18/15 0443 04/19/15 0604 04/20/15 0436  WBC 12.1* 12.5* 7.3 10.1 9.6 10.5  NEUTROABS 10.8*  --   --   --   --   --   HGB 15.4 13.0 15.0 13.4 12.8* 12.4*  HCT 45.1 39.4 45.0 39.8 37.8* 36.6*  MCV 85.1 85.8 87.2 86.1 85.3 85.5  PLT 199 157 168 204 230 255    Studies: Dg Chest 1 View  04/19/2015  CLINICAL DATA:  Fever and abdominal pain.  History of diverticulitis. EXAM: CHEST 1 VIEW COMPARISON:  None. FINDINGS: Heart size is normal. There is elevation of the right hemidiaphragm. Focal density at the left lung base may represent atelectasis and was not identified on the recent abdomen and pelvic CT. There is no pulmonary edema. IMPRESSION: 1. Question of developing infiltrate or atelectasis at the left lung base. 2. Followup is recommended. A PA and lateral chest x-ray is recommended if the patient is able to tolerate the exam. Electronically Signed   By: Nolon Nations M.D.   On: 04/19/2015 15:01   Dg Chest 2 View  04/19/2015  CLINICAL DATA:  Followup prior AP chest x-ray with possible basilar infiltrates. EXAM: CHEST  2 VIEW COMPARISON:   Chest x-ray 04/19/2015 and CT scan of the abdomen 04/15/2015 FINDINGS: The cardiac silhouette, mediastinal and hilar contours are within normal limits and stable. There are bibasilar infiltrates and a the possible right pleural effusion. The bony thorax is intact. Air-filled small bowel loops are noted in the upper central abdomen IMPRESSION: Bibasilar infiltrates. Electronically Signed   By: Marijo Sanes M.D.   On: 04/19/2015 16:33    Scheduled Meds: . antiseptic oral rinse  7 mL Mouth Rinse BID  . enoxaparin (LOVENOX) injection  40 mg Subcutaneous QHS  . Influenza vac split quadrivalent PF  0.5 mL Intramuscular Tomorrow-1000  . piperacillin-tazobactam (ZOSYN)  IV  3.375 g Intravenous 3 times per day  . potassium chloride  20 mEq Oral BID PC  . sodium chloride  3 mL Intravenous Q12H   Continuous Infusions:    Principal Problem:   Diverticulitis of intestine with perforation without bleeding    Time spent: 63 minutes     Truong Delcastillo MD PhD [phone] [facility] 04/20/2015, 2:57 PM  LOS: 5 days    After 7pm please refer to [employer], password [facility]

## 2015-04-21 LAB — BASIC METABOLIC PANEL
ANION GAP: 10 (ref 5–15)
BUN: 10 mg/dL (ref 6–20)
CALCIUM: 8.1 mg/dL — AB (ref 8.9–10.3)
CHLORIDE: 106 mmol/L (ref 101–111)
CO2: 23 mmol/L (ref 22–32)
CREATININE: 0.88 mg/dL (ref 0.61–1.24)
GFR calc non Af Amer: >60 mL/min (ref >60)
GLUCOSE: 105 mg/dL — AB (ref 65–99)
Potassium: 3.1 mmol/L — ABNORMAL LOW (ref 3.5–5.1)
Sodium: 139 mmol/L (ref 135–145)

## 2015-04-21 LAB — CBC
HEMATOCRIT: 36 % — AB (ref 39.0–52.0)
HEMOGLOBIN: 12.1 g/dL — AB (ref 13.0–17.0)
MCH: 28.6 pg (ref 26.0–34.0)
MCHC: 33.6 g/dL (ref 30.0–36.0)
MCV: 85.1 fL (ref 78.0–100.0)
Platelets: 308 10*3/uL (ref 150–400)
RBC: 4.23 MIL/uL (ref 4.22–5.81)
RDW: 13.7 % (ref 11.5–15.5)
WBC: 10.6 10*3/uL — ABNORMAL HIGH (ref 4.0–10.5)

## 2015-04-21 LAB — LACTIC ACID, PLASMA: Lactic Acid, Venous: 0.8 mmol/L (ref 0.5–2.0)

## 2015-04-21 MED ORDER — METRONIDAZOLE 500 MG PO TABS
500.0000 mg | ORAL_TABLET | Freq: Three times a day (TID) | ORAL | Status: DC
  Administered 2015-04-21 – 2015-04-22 (×2): 500 mg via ORAL
  Filled 2015-04-21 (×3): qty 1

## 2015-04-21 MED ORDER — CIPROFLOXACIN HCL 500 MG PO TABS
500.0000 mg | ORAL_TABLET | Freq: Two times a day (BID) | ORAL | Status: DC
  Administered 2015-04-21 – 2015-04-22 (×2): 500 mg via ORAL
  Filled 2015-04-21 (×2): qty 1

## 2015-04-21 MED ORDER — POTASSIUM CHLORIDE CRYS ER 20 MEQ PO TBCR
40.0000 meq | EXTENDED_RELEASE_TABLET | Freq: Once | ORAL | Status: AC
  Administered 2015-04-21: 40 meq via ORAL
  Filled 2015-04-21: qty 2

## 2015-04-21 NOTE — Progress Notes (Signed)
[facility] PROGRESS NOTE  Dennis Diaz HCW:[id number] DOB: 20-May-1946 DOA: 04/15/2015 PCP: Criselda Peaches, MD  Assessment/Plan: 1. Acute Sigmoid Diverticulitis with microperforation  -Patient presented to the ED on 10/16 with abdominal pain and nausea. CT performed at that time demonstrated sigmoid diverticulitis with microperforation but no abscess. -Surgery was consulted and agreed to continue non-operative management and consider advancing diet to clear liquids. -Continue Zosyn, pain management, consider repeat CT scan in 2-3 days if pain and fever continue to persist. -over all improving, no pain, no n/v today, watery diarrhea ( c diff negative) stopped, fever has finally subsided, switch to oral abx and advance diet per surgery   2. Fever: likely from #1 repeat ua unremarkable,  blood culture no growth, cxr , mild pleural effusion, ivf d/ced. -fever has finally subsided, switch abx to oral cipro and flagyl  3. Sinus tachycardia: with frequent pac's and pvc's, seems pain related,  tsh wnl, resolved, d/c tele.    Code Status: Full Code  Family Communication: patient and wife Disposition Plan: home when medically ready   Consultants:  [facility]  Antibiotics:  Zosyn from admission to 10/22  Oral cipro and flagyl from 10/22  HPI/Subjective: Fever has finally subsided, denies pain, no n/v. Wife in room.   Objective: Filed Vitals:   04/21/15 1410  BP: 156/80  Pulse: 77  Temp: 99.1 F (37.3 C)  Resp: 20    Intake/Output Summary (Last 24 hours) at 04/21/15 1639 Last data filed at 04/21/15 0555  Gross per 24 hour  Intake    100 ml  Output   1650 ml  Net  -1550 ml   Filed Weights   04/19/15 0514 04/19/15 2053 04/21/15 0657  Weight: 229 lb (103.874 kg) 228 lb 1.6 oz (103.465 kg) 227 lb 11.8 oz (103.3 kg)    Exam:   General:  Feeling better, no apparent distress.  Cardiovascular: Regular rate and rhythm, no murmurs.   Respiratory:  Respirations are unlabored. Breath sounds are clear and equal bilaterally, no wheezes.   Abdomen: Bowel sounds are present in all 4 quadrants. Abdomen is soft , nontender  Musculoskeletal: No apparent deficits.    Data Reviewed: Basic Metabolic Panel:  Recent Labs Lab 04/18/15 0443 04/18/15 1302 04/19/15 0604 04/20/15 0436 04/21/15 0510  NA 136 138 139 136 139  K 4.0 3.8 3.5 3.4* 3.1*  CL 104 105 105 105 106  CO2 24 26 25 23 23   GLUCOSE 135* 122* 116* 107* 105*  BUN 14 15 13 12 10   CREATININE 1.17 1.07 1.06 0.95 0.88  CALCIUM 8.1* 8.1* 8.0* 7.8* 8.1*  MG  --   --  2.1  --   --    Liver Function Tests:  Recent Labs Lab 04/15/15 1710 04/16/15 0440 04/17/15 0917 04/18/15 1302  AST 24 21 19 18   ALT 23 20 17  15*  ALKPHOS 73 55 58 54  BILITOT 1.1 2.0* 1.9* 1.0  PROT 7.7 6.4* 6.7 6.7  ALBUMIN 4.5 3.6 3.4* 3.0*    Recent Labs Lab 04/15/15 1710  LIPASE 25   No results for input(s): AMMONIA in the last 168 hours. CBC:  Recent Labs Lab 04/15/15 1710  04/18/15 0443 04/19/15 0604 04/20/15 0436 04/20/15 2230 04/21/15 0510  WBC 12.1*  < > 10.1 9.6 10.5 11.4* 10.6*  NEUTROABS 10.8*  --   --   --   --  9.0*  --   HGB 15.4  < > 13.4 12.8* 12.4* 12.1* 12.1*  HCT 45.1  < > 39.8 37.8* 36.6* 35.6* 36.0*  MCV 85.1  < > 86.1 85.3 85.5 85.2 85.1  PLT 199  < > 204 230 255 279 308  < > = values in this interval not displayed.  Studies: No results found.  Scheduled Meds: . antiseptic oral rinse  7 mL Mouth Rinse BID  . ciprofloxacin  500 mg Oral BID  . enoxaparin (LOVENOX) injection  40 mg Subcutaneous QHS  . Influenza vac split quadrivalent PF  0.5 mL Intramuscular Tomorrow-1000  . metroNIDAZOLE  500 mg Oral 3 times per day  . potassium chloride  20 mEq Oral BID PC  . sodium chloride  3 mL Intravenous Q12H   Continuous Infusions:    Principal Problem:   Diverticulitis of intestine with perforation without bleeding    Time spent: 20 minutes     Shaquilla Kehres MD  PhD [phone] [facility] 04/21/2015, 4:39 PM  LOS: 6 days    After 7pm please refer to [employer], password [facility]

## 2015-04-21 NOTE — Progress Notes (Signed)
Subjective: No complaints. Feeling well  Objective: Vital signs in last 24 hours: Temp:  [98.7 F (37.1 C)-100.2 F (37.9 C)] 99 F (37.2 C) (10/22 0657) Pulse Rate:  [73-78] 73 (10/22 0657) Resp:  [17-20] 20 (10/22 0657) BP: (137-155)/(73-82) 155/82 mmHg (10/22 0657) SpO2:  [94 %-98 %] 94 % (10/22 0657) Weight:  [103.3 kg (227 lb 11.8 oz)] 103.3 kg (227 lb 11.8 oz) (10/22 0657) Last BM Date: 04/20/15  Intake/Output from previous day: 10/21 0701 - 10/22 0700 In: 50 [IV Piggyback:50] Out: 1856 [Urine:1650] Intake/Output this shift: Total I/O In: 50 [IV Piggyback:50] Out: 900 [Urine:900]  Resp: clear to auscultation bilaterally Cardio: regular rate and rhythm GI: soft, nontender  Lab Results:   Recent Labs  04/20/15 2230 04/21/15 0510  WBC 11.4* 10.6*  HGB 12.1* 12.1*  HCT 35.6* 36.0*  PLT 279 308   BMET  Recent Labs  04/20/15 0436 04/21/15 0510  NA 136 139  K 3.4* 3.1*  CL 105 106  CO2 23 23  GLUCOSE 107* 105*  BUN 12 10  CREATININE 0.95 0.88  CALCIUM 7.8* 8.1*   PT/INR No results for input(s): LABPROT, INR in the last 72 hours. ABG No results for input(s): PHART, HCO3 in the last 72 hours.  Invalid input(s): PCO2, PO2  Studies/Results: Dg Chest 1 View  04/19/2015  CLINICAL DATA:  Fever and abdominal pain. History of diverticulitis. EXAM: CHEST 1 VIEW COMPARISON:  None. FINDINGS: Heart size is normal. There is elevation of the right hemidiaphragm. Focal density at the left lung base may represent atelectasis and was not identified on the recent abdomen and pelvic CT. There is no pulmonary edema. IMPRESSION: 1. Question of developing infiltrate or atelectasis at the left lung base. 2. Followup is recommended. A PA and lateral chest x-ray is recommended if the patient is able to tolerate the exam. Electronically Signed   By: Nolon Nations M.D.   On: 04/19/2015 15:01   Dg Chest 2 View  04/19/2015  CLINICAL DATA:  Followup prior AP chest x-ray  with possible basilar infiltrates. EXAM: CHEST  2 VIEW COMPARISON:  Chest x-ray 04/19/2015 and CT scan of the abdomen 04/15/2015 FINDINGS: The cardiac silhouette, mediastinal and hilar contours are within normal limits and stable. There are bibasilar infiltrates and a the possible right pleural effusion. The bony thorax is intact. Air-filled small bowel loops are noted in the upper central abdomen IMPRESSION: Bibasilar infiltrates. Electronically Signed   By: Marijo Sanes M.D.   On: 04/19/2015 16:33    Anti-infectives: Anti-infectives    Start     Dose/Rate Route Frequency Ordered Stop   04/16/15 0600  metroNIDAZOLE (FLAGYL) IVPB 500 mg  Status:  Discontinued     500 mg 100 mL/hr over 60 Minutes Intravenous 3 times per day 04/15/15 2213 04/15/15 2334   04/16/15 0600  ciprofloxacin (CIPRO) IVPB 400 mg  Status:  Discontinued     400 mg 200 mL/hr over 60 Minutes Intravenous Every 12 hours 04/15/15 2225 04/15/15 2334   04/15/15 2345  piperacillin-tazobactam (ZOSYN) IVPB 3.375 g     3.375 g 12.5 mL/hr over 240 Minutes Intravenous 3 times per day 04/15/15 2334     04/15/15 1845  metroNIDAZOLE (FLAGYL) IVPB 500 mg     500 mg 100 mL/hr over 60 Minutes Intravenous  Once 04/15/15 1839 04/15/15 2000   04/15/15 1845  ciprofloxacin (CIPRO) IVPB 400 mg     400 mg 200 mL/hr over 60 Minutes Intravenous  Once 04/15/15 1839 04/15/15  1950      Assessment/Plan: s/p * No surgery found * Advance diet. Start soft foods today Once fever improves then consider switching to oral abx ambulate  LOS: 6 days    TOTH III,PAUL S 04/21/2015

## 2015-04-22 LAB — BASIC METABOLIC PANEL
ANION GAP: 10 (ref 5–15)
BUN: 9 mg/dL (ref 6–20)
CALCIUM: 8.3 mg/dL — AB (ref 8.9–10.3)
CO2: 23 mmol/L (ref 22–32)
CREATININE: 0.84 mg/dL (ref 0.61–1.24)
Chloride: 104 mmol/L (ref 101–111)
GLUCOSE: 106 mg/dL — AB (ref 65–99)
Potassium: 3.3 mmol/L — ABNORMAL LOW (ref 3.5–5.1)
Sodium: 137 mmol/L (ref 135–145)

## 2015-04-22 LAB — CBC
HCT: 36 % — ABNORMAL LOW (ref 39.0–52.0)
Hemoglobin: 12.2 g/dL — ABNORMAL LOW (ref 13.0–17.0)
MCH: 28.8 pg (ref 26.0–34.0)
MCHC: 33.9 g/dL (ref 30.0–36.0)
MCV: 85.1 fL (ref 78.0–100.0)
PLATELETS: 353 10*3/uL (ref 150–400)
RBC: 4.23 MIL/uL (ref 4.22–5.81)
RDW: 13.8 % (ref 11.5–15.5)
WBC: 10.4 10*3/uL (ref 4.0–10.5)

## 2015-04-22 MED ORDER — POTASSIUM CHLORIDE CRYS ER 20 MEQ PO TBCR
40.0000 meq | EXTENDED_RELEASE_TABLET | Freq: Once | ORAL | Status: AC
  Administered 2015-04-22: 40 meq via ORAL
  Filled 2015-04-22: qty 2

## 2015-04-22 MED ORDER — CIPROFLOXACIN HCL 500 MG PO TABS: 500.0000 mg | ORAL_TABLET | Freq: Two times a day (BID) | ORAL | Status: DC

## 2015-04-22 MED ORDER — METRONIDAZOLE 500 MG PO TABS: 500.0000 mg | ORAL_TABLET | Freq: Three times a day (TID) | ORAL | Status: DC

## 2015-04-22 MED ORDER — POTASSIUM CHLORIDE CRYS ER 20 MEQ PO TBCR: 20.0000 meq | EXTENDED_RELEASE_TABLET | Freq: Every day | ORAL | Status: DC

## 2015-04-22 NOTE — Plan of Care (Signed)
Problem: Food- and Nutrition-Related Knowledge Deficit (NB-1.1) Goal: Nutrition education Formal process to instruct or train a patient/client in a skill or to impart knowledge to help patients/clients voluntarily manage or modify food choices and eating behavior to maintain or improve health. Outcome: Completed/Met Date Met:  04/22/15 Nutrition Education Note  RD consulted for nutrition education regarding diverticulitis.  RD provided "Low Fiber Nutrition Therapy" handout from the [employer]. Reviewed low and high fiber foods. Encouraged use of multivitamin and probiotic. Discouraged intake of high fiber foods and red meat. Teach back method used.  Expect good compliance with support from wife. Patient seems very determined.  Body mass index is 28.63 kg/(m^2). Pt meets criteria for overweight based on current BMI.  Current diet order is Soft -low sodium, heart healthy, patient is consuming approximately 100% of meals at this time. Labs and medications reviewed. No further nutrition interventions warranted at this time.  If additional nutrition issues arise, please re-consult RD.   Clayton Bibles, MS, RD, LDN Pager: [phone] After Hours Pager: [phone]

## 2015-04-22 NOTE — Discharge Summary (Signed)
Discharge Summary  Dennis Diaz KMM:[id number] DOB: [birth year]  PCP: Criselda Peaches, MD  Admit date: 04/15/2015 Discharge date: 04/22/2015  Time spent: <87mins  Recommendations for Outpatient Follow-up:  1. F/u with PMD within a week, pmd to monitor bp 2. F/u with general surgery 3. F/u with GI   Discharge Diagnoses:  Active Hospital Problems   Diagnosis Date Noted  . Diverticulitis of intestine with perforation without bleeding 04/15/2015    Resolved Hospital Problems   Diagnosis Date Noted Date Resolved  No resolved problems to display.    Discharge Condition: stable  Diet recommendation: heart healthy  Filed Weights   04/19/15 2053 04/21/15 0657 04/22/15 0602  Weight: 228 lb 1.6 oz (103.465 kg) 227 lb 11.8 oz (103.3 kg) 223 lb 1.7 oz (101.2 kg)    History of present illness:  Dennis Diaz is a 69 y.o. male with Past medical history of renal stones. Patient presents with complaints of abdominal pain. The pain has been located in the lower abdomen and feels like sharp crampy pain. Patient mentions that he was at his baseline when he woke up in the morning but later on after lunch he started having some abdominal discomfort. This was followed by 4-5 episodes of severe abdominal cramps which made him curled up. This patient was significant severe than his prior renal stone pain. He started having some nausea but no vomiting. He did have a bowel movement in the morning it is currently passing gas. He started having some fever as well. No chills. No chest pain or shortness of breath and no cough. He denies any recent travel or recent surgeries. The distal patient presented to the hospital.  The patient is coming from home At his baseline ambulates without support And is independent for most of his ADL; manages his medication on his own.  Hospital Course:  Principal Problem:   Diverticulitis of intestine with perforation without bleeding   Acute Sigmoid  Diverticulitis with microperforation -Patient presented to the ED on 10/16 with abdominal pain and nausea. CT performed at that time demonstrated sigmoid diverticulitis with microperforation but no abscess. -Surgery was consulted and agreed to continue non-operative management and consider advancing diet to clear liquids. -Continue Zosyn, pain management, consider repeat CT scan in 2-3 days if pain and fever continue to persist. -over all improving, no pain, no n/v today, watery diarrhea ( c diff negative) stopped, fever has finally subsided, switch to oral abx and advance diet per surgery, cleared to be discharged home with outpatient surgery follow up and repeat ct ab/pel in 1-2 weeks.   2. Fever: likely from #1 repeat ua unremarkable, blood culture no growth, cxr , mild pleural effusion, ivf d/ced. -fever has finally subsided, switch abx to oral cipro and flagyl  3. Sinus tachycardia: with frequent pac's and pvc's, seems pain related, tsh wnl, resolved, d/c tele.    Code Status: Full Code  Family Communication: patient and wife Disposition Plan: home 10/23   Consultants:  [facility]  Antibiotics:  Zosyn from admission to 10/22  Oral cipro and flagyl from 10/22  Discharge Exam: BP 143/84 mmHg  Pulse 67  Temp(Src) 98 F (36.7 C) (Oral)  Resp 18  Ht 6\' 2"  (1.88 m)  Wt 223 lb 1.7 oz (101.2 kg)  BMI 28.63 kg/m2  SpO2 95%   General: Feeling better, no apparent distress.  Cardiovascular: Regular rate and rhythm, no murmurs.   Respiratory: Respirations are unlabored. Breath sounds are clear and equal bilaterally,  no wheezes.   Abdomen: Bowel sounds are present in all 4 quadrants. Abdomen is soft , nontender  Musculoskeletal: No apparent deficits.  Discharge Instructions You were cared for by a hospitalist during your hospital stay. If you have any questions about your discharge medications or the care you received while you were in the hospital  after you are discharged, you can call the unit and asked to speak with the hospitalist on call if the hospitalist that took care of you is not available. Once you are discharged, your primary care physician will handle any further medical issues. Please note that NO REFILLS for any discharge medications will be authorized once you are discharged, as it is imperative that you return to your primary care physician (or establish a relationship with a primary care physician if you do not have one) for your aftercare needs so that they can reassess your need for medications and monitor your lab values.  Discharge Instructions    Diet - low sodium heart healthy    Complete by:  As directed      Increase activity slowly    Complete by:  As directed             Medication List    TAKE these medications        BOSTON SIMPLUS Soln  1 application by Does not apply route daily. Clean out contacts daily     ciprofloxacin 500 MG tablet  Commonly known as:  CIPRO  Take 1 tablet (500 mg total) by mouth 2 (two) times daily.     diclofenac 75 MG EC tablet  Commonly known as:  VOLTAREN  Take 75 mg by mouth 2 (two) times daily as needed (joint pain).     ibuprofen 200 MG tablet  Commonly known as:  ADVIL,MOTRIN  Take 400-600 mg by mouth daily as needed for headache.     metroNIDAZOLE 500 MG tablet  Commonly known as:  FLAGYL  Take 1 tablet (500 mg total) by mouth 3 (three) times daily.     potassium chloride SA 20 MEQ tablet  Commonly known as:  K-DUR,KLOR-CON  Take 1 tablet (20 mEq total) by mouth daily.     VIAGRA 50 MG tablet  Generic drug:  sildenafil  Take 1 tablet by mouth as needed.       Allergies  Allergen Reactions  . Shellfish Allergy Hives       Follow-up Information    Follow up with GREEN, EDWIN JAY, MD In 1 week.   Specialty:  Internal Medicine   Why:  hospital discharge follow up   Contact information:   [address] [place], [address] [place]  [address] [phone]       Follow up with Landry Dyke, MD In 1 month.   Specialty:  Gastroenterology   Why:  diverticulitis   Contact information:   [address] [place] [address] [phone]       Follow up with [facility], MD In 2 weeks.   Specialty:  General Surgery   Why:  diverticulitis   Contact information:   [address] [place] [address] [phone]        The results of significant diagnostics from this hospitalization (including imaging, microbiology, ancillary and laboratory) are listed below for reference.    Significant Diagnostic Studies: Dg Chest 1 View  04/19/2015  CLINICAL DATA:  Fever and abdominal pain. History of diverticulitis. EXAM: CHEST 1 VIEW COMPARISON:  None. FINDINGS: Heart size is normal. There is elevation of the right hemidiaphragm. Focal density at the left lung base may represent atelectasis and was not identified on the recent abdomen and pelvic CT. There is no pulmonary edema. IMPRESSION: 1. Question of developing infiltrate or atelectasis at the left lung base. 2. Followup is recommended. A PA and lateral chest x-ray is recommended if the patient is able to tolerate the exam. Electronically Signed   By: Nolon Nations M.D.   On: 04/19/2015 15:01   Dg Chest 2 View  04/19/2015  CLINICAL DATA:  Followup prior AP chest x-ray with possible basilar infiltrates. EXAM: CHEST  2 VIEW COMPARISON:  Chest x-ray 04/19/2015 and CT scan of the abdomen 04/15/2015 FINDINGS: The cardiac silhouette, mediastinal and hilar contours are within normal limits and stable. There are bibasilar infiltrates and a the possible right pleural effusion. The bony thorax is intact. Air-filled small bowel loops are noted in the upper central abdomen IMPRESSION: Bibasilar infiltrates. Electronically Signed   By: Marijo Sanes M.D.   On: 04/19/2015 16:33   Ct Abdomen Pelvis W Contrast  04/15/2015  CLINICAL DATA:  Right lower quadrant  abdominal pain for one day EXAM: CT ABDOMEN AND PELVIS WITH CONTRAST TECHNIQUE: Multidetector CT imaging of the abdomen and pelvis was performed using the standard protocol following bolus administration of intravenous contrast. CONTRAST:  52mL OMNIPAQUE IOHEXOL 300 MG/ML SOLN, 145mL OMNIPAQUE IOHEXOL 300 MG/ML SOLN COMPARISON:  None. FINDINGS: Lower chest:  Normal Hepatobiliary: 7 mm low-attenuation lesion in the dome of the liver on the left consistent with a cyst with average attenuation value of 3 Pancreas: Normal Spleen: Normal Adrenals/Urinary Tract: Adrenal glands are normal. 15 mm upper pole right renal cyst. Left kidney and bladder normal. Stomach/Bowel: There is moderate diverticulosis of the sigmoid colon. There is moderate surrounding inflammatory change. There are a few bubbles of free air in the surrounding mesentery. There is a right inguinal hernia which contains a few loops of small bowel. There is no evidence of obstruction. There is a small volume of free fluid within the inguinal hernia. There is also a small volume of free fluid within the pelvis dependently. The appendix is not dilated with no evidence of wall thickening. There is mild inflammatory change in the region of the appendix and cecal tip. Vascular/Lymphatic: Mild atherosclerotic calcification of the aorta Reproductive:  normal Other: Small volume free fluid. Musculoskeletal: No acute findings IMPRESSION: Findings consistent with sigmoid colon diverticulitis. There is evidence of micro perforation with no evidence of abscess. There is a small volume of free fluid dependently within the pelvis as well as within a right inguinal hernia. The right inguinal hernia contains a few loops of small bowel which show no wall thickening or inflammation in the fluid is likely present within the inguinal hernia as a result of gravity. There is also mild inflammatory change with trace free fluid in the right pericolic gutter near the appendix and  cecal tip. The appendix and cecum appear otherwise entirely normal. It is considered unlikely that there is a second focus of inflammation involving either the appendix were the cecum, but more likely that the inflammation and trace free fluid are the result of the diverticulitis elsewhere in the pelvis. Critical Value/emergent results were called by telephone at the time of interpretation on 04/15/2015 at 6:35 pm to Dr. Ezequiel Essex , who verbally acknowledged these results. Electronically Signed   By: Skipper Cliche M.D.   On: 04/15/2015 18:39  Microbiology: Recent Results (from the past 240 hour(s))  C difficile quick scan w PCR reflex     Status: None   Collection Time: 04/18/15  2:00 PM  Result Value Ref Range Status   C Diff antigen NEGATIVE NEGATIVE Final   C Diff toxin NEGATIVE NEGATIVE Final   C Diff interpretation Negative for toxigenic C. difficile  Final  Culture, blood (routine x 2)     Status: None (Preliminary result)   Collection Time: 04/19/15  2:20 PM  Result Value Ref Range Status   Specimen Description BLOOD RIGHT ARM  Final   Special Requests BOTTLES DRAWN AEROBIC AND ANAEROBIC 10CC  Final   Culture   Final    NO GROWTH 3 DAYS Performed at [facility]    Report Status PENDING  Incomplete  Culture, blood (routine x 2)     Status: None (Preliminary result)   Collection Time: 04/19/15  2:31 PM  Result Value Ref Range Status   Specimen Description BLOOD RIGHT HAND  Final   Special Requests BOTTLES DRAWN AEROBIC ONLY 5CC  Final   Culture   Final    NO GROWTH 3 DAYS Performed at [facility]    Report Status PENDING  Incomplete     Labs: Basic Metabolic Panel:  Recent Labs Lab 04/18/15 1302 04/19/15 0604 04/20/15 0436 04/21/15 0510 04/22/15 0610  NA 138 139 136 139 137  K 3.8 3.5 3.4* 3.1* 3.3*  CL 105 105 105 106 104  CO2 26 25 23 23 23   GLUCOSE 122* 116* 107* 105* 106*  BUN 15 13 12 10 9   CREATININE 1.07 1.06 0.95 0.88 0.84    CALCIUM 8.1* 8.0* 7.8* 8.1* 8.3*  MG  --  2.1  --   --   --    Liver Function Tests:  Recent Labs Lab 04/15/15 1710 04/16/15 0440 04/17/15 0917 04/18/15 1302  AST 24 21 19 18   ALT 23 20 17  15*  ALKPHOS 73 55 58 54  BILITOT 1.1 2.0* 1.9* 1.0  PROT 7.7 6.4* 6.7 6.7  ALBUMIN 4.5 3.6 3.4* 3.0*    Recent Labs Lab 04/15/15 1710  LIPASE 25   No results for input(s): AMMONIA in the last 168 hours. CBC:  Recent Labs Lab 04/15/15 1710  04/19/15 0604 04/20/15 0436 04/20/15 2230 04/21/15 0510 04/22/15 0610  WBC 12.1*  < > 9.6 10.5 11.4* 10.6* 10.4  NEUTROABS 10.8*  --   --   --  9.0*  --   --   HGB 15.4  < > 12.8* 12.4* 12.1* 12.1* 12.2*  HCT 45.1  < > 37.8* 36.6* 35.6* 36.0* 36.0*  MCV 85.1  < > 85.3 85.5 85.2 85.1 85.1  PLT 199  < > 230 255 279 308 353  < > = values in this interval not displayed. Cardiac Enzymes: No results for input(s): CKTOTAL, CKMB, CKMBINDEX, TROPONINI in the last 168 hours. BNP: BNP (last 3 results) No results for input(s): BNP in the last 8760 hours.  ProBNP (last 3 results) No results for input(s): PROBNP in the last 8760 hours.  CBG: No results for input(s): GLUCAP in the last 168 hours.     SignedFlorencia Reasons MD, PhD  [facility] 04/22/2015, 11:50 AM

## 2015-04-22 NOTE — Progress Notes (Signed)
Report received from J. Scotton, RN. No change from initial pm assessment. Will continue to monitor and follow the POC. 

## 2015-04-22 NOTE — Progress Notes (Signed)
  Subjective: No complaints. Feels well  Objective: Vital signs in last 24 hours: Temp:  [98 F (36.7 C)-99.1 F (37.3 C)] 98 F (36.7 C) (10/23 0602) Pulse Rate:  [67-77] 67 (10/23 0602) Resp:  [18-20] 18 (10/23 0602) BP: (143-156)/(80-84) 143/84 mmHg (10/23 0602) SpO2:  [95 %-99 %] 95 % (10/23 0602) Weight:  [101.2 kg (223 lb 1.7 oz)] 101.2 kg (223 lb 1.7 oz) (10/23 0602) Last BM Date: 04/21/15  Intake/Output from previous day: 10/22 0701 - 10/23 0700 In: 240 [P.O.:240] Out: 875 [Urine:875] Intake/Output this shift:    Resp: clear to auscultation bilaterally Cardio: regular rate and rhythm GI: soft, non-tender; bowel sounds normal; no masses,  no organomegaly  Lab Results:   Recent Labs  04/21/15 0510 04/22/15 0610  WBC 10.6* 10.4  HGB 12.1* 12.2*  HCT 36.0* 36.0*  PLT 308 353   BMET  Recent Labs  04/21/15 0510 04/22/15 0610  NA 139 137  K 3.1* 3.3*  CL 106 104  CO2 23 23  GLUCOSE 105* 106*  BUN 10 9  CREATININE 0.88 0.84  CALCIUM 8.1* 8.3*   PT/INR No results for input(s): LABPROT, INR in the last 72 hours. ABG No results for input(s): PHART, HCO3 in the last 72 hours.  Invalid input(s): PCO2, PO2  Studies/Results: No results found.  Anti-infectives: Anti-infectives    Start     Dose/Rate Route Frequency Ordered Stop   04/21/15 2200  metroNIDAZOLE (FLAGYL) tablet 500 mg     500 mg Oral 3 times per day 04/21/15 1639     04/21/15 2000  ciprofloxacin (CIPRO) tablet 500 mg     500 mg Oral 2 times daily 04/21/15 1639     04/16/15 0600  metroNIDAZOLE (FLAGYL) IVPB 500 mg  Status:  Discontinued     500 mg 100 mL/hr over 60 Minutes Intravenous 3 times per day 04/15/15 2213 04/15/15 2334   04/16/15 0600  ciprofloxacin (CIPRO) IVPB 400 mg  Status:  Discontinued     400 mg 200 mL/hr over 60 Minutes Intravenous Every 12 hours 04/15/15 2225 04/15/15 2334   04/15/15 2345  piperacillin-tazobactam (ZOSYN) IVPB 3.375 g  Status:  Discontinued     3.375  g 12.5 mL/hr over 240 Minutes Intravenous 3 times per day 04/15/15 2334 04/21/15 1638   04/15/15 1845  metroNIDAZOLE (FLAGYL) IVPB 500 mg     500 mg 100 mL/hr over 60 Minutes Intravenous  Once 04/15/15 1839 04/15/15 2000   04/15/15 1845  ciprofloxacin (CIPRO) IVPB 400 mg     400 mg 200 mL/hr over 60 Minutes Intravenous  Once 04/15/15 1839 04/15/15 1950      Assessment/Plan: s/p * No surgery found * tolerating oral abx  Ok for discharge from surgical standpoint. Can repeat CT as outpt in next week or two  LOS: 7 days    TOTH III,Dan Scearce S 04/22/2015

## 2015-04-24 LAB — CULTURE, BLOOD (ROUTINE X 2)
CULTURE: NO GROWTH
Culture: NO GROWTH

## 2015-05-15 ENCOUNTER — Other Ambulatory Visit: Payer: Self-pay | Admitting: Surgery

## 2015-05-15 DIAGNOSIS — K5720 Diverticulitis of large intestine with perforation and abscess without bleeding: Secondary | ICD-10-CM

## 2015-05-17 ENCOUNTER — Other Ambulatory Visit: Payer: Medicare Other

## 2015-05-23 ENCOUNTER — Ambulatory Visit
Admission: RE | Admit: 2015-05-23 | Discharge: 2015-05-23 | Disposition: A | Discharge status: Home / Self Care | Payer: Medicare Other | Source: Ambulatory Visit | Attending: Surgery | Admitting: Surgery

## 2015-05-23 DIAGNOSIS — K5720 Diverticulitis of large intestine with perforation and abscess without bleeding: Secondary | ICD-10-CM

## 2015-05-23 MED ORDER — IOPAMIDOL (ISOVUE-300) INJECTION 61%
125.0000 mL | Freq: Once | INTRAVENOUS | Status: AC | PRN
  Administered 2015-05-23: 125 mL via INTRAVENOUS

## 2016-09-01 ENCOUNTER — Encounter: Payer: Self-pay | Admitting: Gastroenterology

## 2016-10-20 ENCOUNTER — Ambulatory Visit (AMBULATORY_SURGERY_CENTER): Payer: Self-pay | Admitting: *Deleted

## 2016-10-20 VITALS — Ht 74.0 in | Wt 223.0 lb

## 2016-10-20 DIAGNOSIS — Z86010 Personal history of colonic polyps: Secondary | ICD-10-CM

## 2016-10-20 MED ORDER — NA SULFATE-K SULFATE-MG SULF 17.5-3.13-1.6 GM/177ML PO SOLN: 1.0000 | Freq: Once | ORAL | 0 refills | Status: AC

## 2016-10-20 NOTE — Progress Notes (Signed)
No egg or soy allergy known to patient  No issues with past sedation with any surgeries  or procedures, never been  Intubated  No diet pills per patient No home 02 use per patient  No blood thinners per patient  Pt denies issues with constipation  No A fib or A flutter  EMMI video sent to pt's e mail

## 2016-10-21 ENCOUNTER — Encounter: Payer: Self-pay | Admitting: Gastroenterology

## 2016-11-03 ENCOUNTER — Ambulatory Visit (AMBULATORY_SURGERY_CENTER): Payer: Medicare Other | Admitting: Gastroenterology

## 2016-11-03 ENCOUNTER — Encounter: Payer: Self-pay | Admitting: Gastroenterology

## 2016-11-03 VITALS — BP 107/74 | HR 49 | Temp 98.6°F | Resp 15 | Ht 74.0 in | Wt 223.0 lb

## 2016-11-03 DIAGNOSIS — Z86010 Personal history of colonic polyps: Secondary | ICD-10-CM | POA: Diagnosis present

## 2016-11-03 DIAGNOSIS — D122 Benign neoplasm of ascending colon: Secondary | ICD-10-CM | POA: Diagnosis not present

## 2016-11-03 MED ORDER — SODIUM CHLORIDE 0.9 % IV SOLN: 500.0000 mL | INTRAVENOUS | Status: DC

## 2016-11-03 NOTE — Progress Notes (Signed)
A/ox3, pleased with MAC, report to RN 

## 2016-11-03 NOTE — Op Note (Signed)
[name] Procedure Date: 11/03/2016 7:53 AM MRN: [id number] Endoscopist: Mallie Mussel L. Loletha Carrow , MD Age: 71 Referring MD:  Date of Birth: 03-15-1946 Gender: Male Account #: [id number] Procedure:                Colonoscopy Indications:              Surveillance: Personal history of adenomatous                            polyps on last colonoscopy 5 years ago Medicines:                Monitored Anesthesia Care Procedure:                Pre-Anesthesia Assessment:                           - Prior to the procedure, a History and Physical                            was performed, and patient medications and                            allergies were reviewed. The patient's tolerance of                            previous anesthesia was also reviewed. The risks                            and benefits of the procedure and the sedation                            options and risks were discussed with the patient.                            All questions were answered, and informed consent                            was obtained. Prior Anticoagulants: The patient has                            taken no previous anticoagulant or antiplatelet                            agents. ASA Grade Assessment: II - A patient with                            mild systemic disease. After reviewing the risks                            and benefits, the patient was deemed in                            satisfactory condition to undergo the procedure.  After obtaining informed consent, the colonoscope                            was passed under direct vision. Throughout the                            procedure, the patient's blood pressure, pulse, and                            oxygen saturations were monitored continuously. The                            Colonoscope was introduced through the anus and                            advanced to the the cecum,  identified by                            appendiceal orifice and ileocecal valve. The                            colonoscopy was performed without difficulty. The                            patient tolerated the procedure well. The quality                            of the bowel preparation was excellent. The                            ileocecal valve, appendiceal orifice, and rectum                            were photographed. The quality of the bowel                            preparation was evaluated using the BBPS [facility]                            Bowel Preparation Scale) with scores of: Right                            Colon = 3, Transverse Colon = 3 and Left Colon = 3                            (entire mucosa seen well with no residual staining,                            small fragments of stool or opaque liquid). The                            total BBPS score equals 9. The bowel preparation  used was SUPREP. Scope In: 8:02:08 AM Scope Out: 8:15:06 AM Scope Withdrawal Time: 0 hours 10 minutes 59 seconds  Total Procedure Duration: 0 hours 12 minutes 58 seconds  Findings:                 Multiple medium-mouthed diverticula were found in                            the left colon.                           Two sessile polyps were found in the proximal                            ascending colon. The polyps were 2 mm in size.                            These polyps were removed with a cold biopsy                            forceps. Resection and retrieval were complete.                           The exam was otherwise without abnormality on                            direct and retroflexion views. Complications:            No immediate complications. Estimated Blood Loss:     Estimated blood loss: none. Impression:               - Diverticulosis in the left colon.                           - Two 2 mm polyps in the proximal ascending colon,                             removed with a cold biopsy forceps. Resected and                            retrieved.                           - The examination was otherwise normal on direct                            and retroflexion views. Recommendation:           - Patient has a contact number available for                            emergencies. The signs and symptoms of potential                            delayed complications were discussed with the  patient. Return to normal activities tomorrow.                            Written discharge instructions were provided to the                            patient.                           - Resume previous diet.                           - Continue present medications.                           - Await pathology results.                           - Repeat colonoscopy is recommended for                            surveillance. The colonoscopy date will be                            determined after pathology results from today's                            exam become available for review. Dennis Diaz L. Loletha Carrow, MD 11/03/2016 8:18:37 AM This report has been signed electronically.

## 2016-11-03 NOTE — Progress Notes (Signed)
Called to room to assist during endoscopic procedure.  Patient ID and intended procedure confirmed with present staff. Received instructions for my participation in the procedure from the performing physician.  

## 2016-11-03 NOTE — Progress Notes (Signed)
Pt. Reports no change in surgical or medical history since pre-visit 10/20/2016.

## 2016-11-04 ENCOUNTER — Telehealth: Payer: Self-pay | Admitting: *Deleted

## 2016-11-04 NOTE — Telephone Encounter (Signed)
  Follow up Call-  Call back number 11/03/2016  Post procedure Call Back phone  # [phone]  Some recent data might be hidden     Patient questions:  Do you have a fever, pain , or abdominal swelling? No. Pain Score  0 *  Have you tolerated food without any problems? Yes.    Have you been able to return to your normal activities? Yes.    Do you have any questions about your discharge instructions: Diet   No. Medications  No. Follow up visit  No.  Do you have questions or concerns about your Care? No.  Actions: * If pain score is 4 or above: No action needed, pain <4.

## 2016-11-07 ENCOUNTER — Encounter: Payer: Self-pay | Admitting: Gastroenterology

## 2019-06-27 ENCOUNTER — Ambulatory Visit: Payer: Medicare Other | Attending: Internal Medicine

## 2019-06-27 DIAGNOSIS — Z20822 Contact with and (suspected) exposure to covid-19: Secondary | ICD-10-CM

## 2019-06-29 LAB — NOVEL CORONAVIRUS, NAA: SARS-CoV-2, NAA: NOT DETECTED

## 2019-07-28 ENCOUNTER — Ambulatory Visit: Payer: Medicare Other

## 2019-08-02 ENCOUNTER — Ambulatory Visit: Payer: Medicare Other

## 2019-08-05 ENCOUNTER — Ambulatory Visit: Payer: Medicare Other | Attending: Internal Medicine

## 2019-08-05 DIAGNOSIS — Z23 Encounter for immunization: Secondary | ICD-10-CM | POA: Insufficient documentation

## 2019-08-05 NOTE — Progress Notes (Signed)
   Covid-19 Vaccination Clinic  Name:  Dennis Diaz    MRN: [id number] DOB: Feb 27, 1946  08/05/2019  [name] was observed post Covid-19 immunization for 15 minutes without incidence. He was provided with Vaccine Information Sheet and instruction to access the V-Safe system.   [name] was instructed to call 911 with any severe reactions post vaccine: Marland Kitchen Difficulty breathing  . Swelling of your face and throat  . A fast heartbeat  . A bad rash all over your body  . Dizziness and weakness    Immunizations Administered    Name Date Dose VIS Date Route   Pfizer COVID-19 Vaccine 08/05/2019  3:54 PM 0.3 mL 06/10/2019 Intramuscular   Manufacturer: [place]   Lot: [id number]   [place]: [id number]

## 2019-08-30 ENCOUNTER — Ambulatory Visit: Payer: Medicare Other | Attending: Internal Medicine

## 2019-08-30 DIAGNOSIS — Z23 Encounter for immunization: Secondary | ICD-10-CM | POA: Insufficient documentation

## 2019-08-30 NOTE — Progress Notes (Signed)
   Covid-19 Vaccination Clinic  Name:  Dennis Diaz    MRN: [id number] DOB: 12-Nov-1945  08/30/2019  [name] was observed post Covid-19 immunization for 15 minutes without incident. He was provided with Vaccine Information Sheet and instruction to access the V-Safe system.   [name] was instructed to call 911 with any severe reactions post vaccine: Marland Kitchen Difficulty breathing  . Swelling of face and throat  . A fast heartbeat  . A bad rash all over body  . Dizziness and weakness   Immunizations Administered    Name Date Dose VIS Date Route   Pfizer COVID-19 Vaccine 08/30/2019  3:21 PM 0.3 mL 06/10/2019 Intramuscular   Manufacturer: [place]   Lot: [id number]   [place]: [id number]

## 2019-10-12 ENCOUNTER — Ambulatory Visit: Payer: Medicare Other | Admitting: Dermatology

## 2019-10-12 ENCOUNTER — Other Ambulatory Visit: Payer: Self-pay

## 2019-10-12 ENCOUNTER — Encounter: Payer: Self-pay | Admitting: Dermatology

## 2019-10-12 DIAGNOSIS — L821 Other seborrheic keratosis: Secondary | ICD-10-CM | POA: Diagnosis not present

## 2019-10-12 DIAGNOSIS — L711 Rhinophyma: Secondary | ICD-10-CM

## 2019-10-12 DIAGNOSIS — Z1283 Encounter for screening for malignant neoplasm of skin: Secondary | ICD-10-CM

## 2019-10-12 DIAGNOSIS — L719 Rosacea, unspecified: Secondary | ICD-10-CM

## 2019-10-12 DIAGNOSIS — L111 Transient acantholytic dermatosis [Grover]: Secondary | ICD-10-CM | POA: Diagnosis not present

## 2019-10-12 DIAGNOSIS — L918 Other hypertrophic disorders of the skin: Secondary | ICD-10-CM

## 2019-10-12 DIAGNOSIS — D1801 Hemangioma of skin and subcutaneous tissue: Secondary | ICD-10-CM

## 2019-10-12 DIAGNOSIS — D229 Melanocytic nevi, unspecified: Secondary | ICD-10-CM

## 2019-10-12 MED ORDER — CLOBETASOL PROP EMOLLIENT BASE 0.05 % EX CREA: 1.0000 "application " | TOPICAL_CREAM | Freq: Two times a day (BID) | CUTANEOUS | 2 refills | Status: DC

## 2019-10-12 NOTE — Progress Notes (Signed)
   Follow-Up Visit   Subjective  Dennis Diaz is a 74 y.o. male who presents for the following: Annual Exam (no new concerns).  moles Location: all over Duration: years Quality: stable Associated Signs/Symptoms: Modifying Factors:  Severity:  Timing: Context: check other spots waist up  The following portions of the chart were reviewed this encounter and updated as appropriate:     Objective  Well appearing patient in no apparent distress; mood and affect are within normal limits.  All skin waist up examined. General skin examination done.  There are no atypical moles nor non-mole skin cancer.  Seborrheic keratoses on the left inner cheek (3 mm) and left upper back (8 mm).  Stucco keratoses on the right arm and thigh.  Scattered skin tags and angiomas.  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  Routine follow-up 1 year.  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

## 2019-10-15 ENCOUNTER — Encounter: Payer: Self-pay | Admitting: Dermatology

## 2019-12-19 ENCOUNTER — Other Ambulatory Visit: Payer: Self-pay

## 2019-12-19 MED ORDER — CLOBETASOL PROP EMOLLIENT BASE 0.05 % EX CREA: 1.0000 "application " | TOPICAL_CREAM | Freq: Two times a day (BID) | CUTANEOUS | 9 refills | Status: DC

## 2020-01-06 ENCOUNTER — Other Ambulatory Visit: Payer: Self-pay

## 2020-01-06 ENCOUNTER — Encounter: Payer: Self-pay | Admitting: Allergy

## 2020-01-06 ENCOUNTER — Ambulatory Visit: Payer: Medicare Other | Admitting: Allergy

## 2020-01-06 VITALS — BP 130/88 | HR 74 | Temp 98.1°F | Resp 16 | Ht 73.5 in | Wt 220.0 lb

## 2020-01-06 DIAGNOSIS — L500 Allergic urticaria: Secondary | ICD-10-CM

## 2020-01-06 DIAGNOSIS — T7840XD Allergy, unspecified, subsequent encounter: Secondary | ICD-10-CM

## 2020-01-06 NOTE — Patient Instructions (Addendum)
Allergic reactions Urticaria (Hives)  - recent reaction without a known or identifiable trigger at this time  - with several episodes of hives as well independent of systemic symptoms  - fish skin testing today is negative as well as fire ant  - will obtain labwork as follows: CBC w diff, CMP, tryptase level, urticaria index, alpha gal panel, fish IgE, fire ant IgE  - to help decrease risk of future reactions recommend taking antihistamine regimen of Allegra 180mg  and Pepcid 20mg  daily.  Allegra is a histamine 1 blocker and Pepcid is a histamine 2 blocker.    - should significant symptoms recur or new symptoms occur, a journal is to be kept recording any foods eaten, beverages consumed, medications taken, activities performed, and environmental conditions within a 6 hour time period prior to the onset of symptoms. For any symptoms concerning for anaphylaxis, epinephrine is to be administered and 911 is to be called immediately.   Follow-up 4-6 months or sooner if needed  **We are ordering labs, so please allow 1-2 weeks for the results to come back.  With the newly implemented Cures Act, the labs might be visible to you at the same time that they become visible to me.  However, I will not address the results until all of the results come  back, so please be patient.  In the meantime, continue avoiding your triggering food(s) in your After Visit Summary, including avoidance measures (if applicable), until you hear from me about the results.

## 2020-01-06 NOTE — Progress Notes (Addendum)
New Patient Note  RE: Dennis Diaz MRN: [id number] DOB: 14-Sep-1945 Date of Office Visit: 01/06/2020  Referring provider: Sueanne Margarita, DO Primary care provider: Sueanne Margarita, DO  Chief Complaint: allergic reactions  History of present illness: Dennis Diaz is a 74 y.o. male presenting today for consultation for allergic reactions.   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 His first event began when he ate fried flounder at a [employer]. Several hours after eating his meal, he experienced lip swelling. He went to his ER and was treated. After it was recommended to go back and eat the same dish at the same restaurant, [name] went back several weeks later and had the same dish without issue. He has eaten flounder with no issues since this event. Additionally, he had two reactions to white fish from [employer], and at that time saw Dr. Cindra Presume in our practice, with inconclusive test results 15 years ago. He has since avoided eating white fish. Over the past several years he has had reactions to a specific group a fish (Mackerel, [place], [place]in, Coulter) which are fish he states is associated with Scombroid poisoning. He has had a reaction to taco salad, where the sauce contained anchovies, which was in the taco sauce. He had itching and swelling, which was alleviated with Benadryl.   His most recent reaction occurred on 12/23/2019 where he was vacationing in [place], [place].  The previous night before his reaction he had a meal of fried flounder and calamari, with french fries and coleslaw for sides, with no effects throughout the evening. The next morning, while setting up umbrellas and chairs for his family at beach, he began to break out into hives on his face and abdomen. He took two Benadryl and sat down to rest with no improvement. Emergency Services were called, and he was transported to the hospital and experienced  blurry vision, sweating, diarrhea, falling, and dizziness. It was noted that his blood pressure was 80/50 and his temperature was 94 F. [name] notes that 3 weeks prior to this episode he was bitten by many fire ants, and postulates that he may have a buildup of histamine. He does not have any other known food allergies. He eats shellfish without issue. He experiences occasional seasonal allergies. He can eat fruits, vegetables, and red meat without issue.     Review of systems: Review of Systems  Constitutional:       See HPI  HENT: Negative.   Eyes:       See HPI  Respiratory: Negative.   Cardiovascular: Negative.   Gastrointestinal:       See HPI   Musculoskeletal:       See HPI  Skin:       See HPI  Neurological:       See HPI    All other systems negative unless noted above in HPI  Past medical history: Past Medical History:  Diagnosis Date  . Angio-edema   . Diverticulitis   . ED (erectile dysfunction)   . Kidney stones    2 episodes in 40 years   . Urticaria     Past surgical history: Past Surgical History:  Procedure Laterality Date  . COLONOSCOPY    . POLYPECTOMY    . root canal     and redo    Family history:  Family History  Problem Relation Age of Onset  .  Alzheimer's disease Mother   . Heart disease Father 89       CABG  . Diabetes Father   . Colon cancer Neg Hx   . Colon polyps Neg Hx   . Rectal cancer Neg Hx   . Stomach cancer Neg Hx   . Angioedema Neg Hx   . Asthma Neg Hx   . Atopy Neg Hx   . Eczema Neg Hx   . Immunodeficiency Neg Hx   . Urticaria Neg Hx   . Allergic rhinitis Neg Hx     Social history:  He lives in a house without carpeting with heat pump.  No pets in the home.  There is no concern for water damage, mildew or roaches in the home.  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 never smoker.   Medication List: Current Outpatient Medications  Medication Sig Dispense Refill  . Clobetasol  Prop Emollient Base (CLOBETASOL PROPIONATE E) 0.05 % emollient cream Apply 1 application topically 2 (two) times daily. 60 g 9  . EPINEPHrine 0.3 mg/0.3 mL IJ SOAJ injection Inject 0.3 mg into the muscle as directed.     Current Facility-Administered Medications  Medication Dose Route Frequency Provider Last Rate Last Admin  . 0.9 %  sodium chloride infusion  500 mL Intravenous Continuous Doran Stabler, MD        Known medication allergies: Allergies  Allergen Reactions  . Fish Allergy Hives    Some fish protiens he is allergic to like hering and mahi mahi- a schromboid issue per pt. - fresh fish is okay      Physical examination: Blood pressure 130/88, pulse 74, temperature 98.1 F (36.7 C), temperature source Temporal, resp. rate 16, height 6' 1.5" (1.867 m), weight 220 lb (99.8 kg), SpO2 98 %.  General: Alert, interactive, in no acute distress. HEENT: PERRLA, TMs pearly gray, turbinates non-edematous without discharge, post-pharynx non erythematous. Neck: Supple without lymphadenopathy. Lungs: Clear to auscultation without wheezing, rhonchi or rales. {no increased work of breathing. CV: Normal S1, S2 without murmurs. Abdomen: Nondistended, nontender. Skin: Warm and dry, without lesions or rashes. Extremities:  No clubbing, cyanosis or edema. Neuro:   Grossly intact.  Diagnositics/Labs:  Allergy testing: select food allergy skin prick testing is negative to fish panel with positive histamine control.  Allergy testing results were read and interpreted by provider, documented by clinical staff.   Assessment and plan:   Allergic reactions Urticaria (Hives)  - recent reaction without a known or identifiable trigger at this time  - with several episodes of hives as well independent of systemic symptoms  - fish skin testing today is negative as well as fire ant  - will obtain labwork as follows: CBC w diff, CMP, tryptase level, urticaria index, alpha gal panel, fish IgE,  fire ant IgE  - to help decrease risk of future reactions recommend taking antihistamine regimen of Allegra 180mg  and Pepcid 20mg  daily.  Allegra is a histamine 1 blocker and Pepcid is a histamine 2 blocker.    - should significant symptoms recur or new symptoms occur, a journal is to be kept recording any foods eaten, beverages consumed, medications taken, activities performed, and environmental conditions within a 6 hour time period prior to the onset of symptoms. For any symptoms concerning for anaphylaxis, epinephrine is to be administered and 911 is to be called immediately.   Follow-up 4-6 months or sooner if needed  I appreciate the opportunity to take  part in Jadriel's care. Please do not hesitate to contact me with questions.  Sincerely,   Prudy Feeler, MD Allergy/Immunology [facility] [place] [place]

## 2020-01-13 LAB — ALPHA-GAL PANEL
Alpha Gal IgE*: 0.17 kU/L — ABNORMAL HIGH (ref <0.10)
Beef (Bos spp) IgE: 0.37 kU/L — ABNORMAL HIGH (ref <0.35)
Class Interpretation: 1
Lamb/Mutton (Ovis spp) IgE: 0.19 kU/L (ref <0.35)
Pork (Sus spp) IgE: 0.2 kU/L (ref <0.35)

## 2020-01-13 LAB — COMPREHENSIVE METABOLIC PANEL
ALT: 26 IU/L (ref 0–44)
AST: 23 IU/L (ref 0–40)
Albumin/Globulin Ratio: 1.9 (ref 1.2–2.2)
Albumin: 4.8 g/dL — ABNORMAL HIGH (ref 3.7–4.7)
Alkaline Phosphatase: 86 IU/L (ref 48–121)
BUN/Creatinine Ratio: 13 (ref 10–24)
BUN: 14 mg/dL (ref 8–27)
Bilirubin Total: 0.3 mg/dL (ref 0.0–1.2)
CO2: 24 mmol/L (ref 20–29)
Calcium: 9.6 mg/dL (ref 8.6–10.2)
Chloride: 102 mmol/L (ref 96–106)
Creatinine, Ser: 1.07 mg/dL (ref 0.76–1.27)
GFR calc Af Amer: 79 mL/min/{1.73_m2} (ref >59)
GFR calc non Af Amer: 68 mL/min/{1.73_m2} (ref >59)
Globulin, Total: 2.5 g/dL (ref 1.5–4.5)
Glucose: 117 mg/dL — ABNORMAL HIGH (ref 65–99)
Potassium: 4.8 mmol/L (ref 3.5–5.2)
Sodium: 142 mmol/L (ref 134–144)
Total Protein: 7.3 g/dL (ref 6.0–8.5)

## 2020-01-13 LAB — CHRONIC URTICARIA: cu index: 15.3 — ABNORMAL HIGH (ref <10)

## 2020-01-13 LAB — CBC WITH DIFFERENTIAL/PLATELET
Basophils Absolute: 0 10*3/uL (ref 0.0–0.2)
Basos: 1 %
EOS (ABSOLUTE): 0.2 10*3/uL (ref 0.0–0.4)
Eos: 3 %
Hematocrit: 49.4 % (ref 37.5–51.0)
Hemoglobin: 16.3 g/dL (ref 13.0–17.7)
Immature Grans (Abs): 0 10*3/uL (ref 0.0–0.1)
Immature Granulocytes: 1 %
Lymphocytes Absolute: 1.2 10*3/uL (ref 0.7–3.1)
Lymphs: 18 %
MCH: 28.8 pg (ref 26.6–33.0)
MCHC: 33 g/dL (ref 31.5–35.7)
MCV: 87 fL (ref 79–97)
Monocytes Absolute: 0.3 10*3/uL (ref 0.1–0.9)
Monocytes: 5 %
Neutrophils Absolute: 4.7 10*3/uL (ref 1.4–7.0)
Neutrophils: 72 %
Platelets: 226 10*3/uL (ref 150–450)
RBC: 5.65 x10E6/uL (ref 4.14–5.80)
RDW: 13 % (ref 11.6–15.4)
WBC: 6.5 10*3/uL (ref 3.4–10.8)

## 2020-01-13 LAB — ALLERGEN PROFILE, FOOD-FISH
Allergen Mackerel IgE: 0.32 kU/L — AB
Allergen Salmon IgE: 0.6 kU/L — AB
Allergen Trout IgE: 0.75 kU/L — AB
Allergen Walley Pike IgE: 0.37 kU/L — AB
Codfish IgE: 0.27 kU/L — AB
Halibut IgE: 0.56 kU/L — AB
Tuna: 0.21 kU/L — AB

## 2020-01-13 LAB — THYROID ANTIBODIES
Thyroglobulin Antibody: <1 IU/mL (ref 0.0–0.9)
Thyroperoxidase Ab SerPl-aCnc: <9 IU/mL (ref 0–34)

## 2020-01-13 LAB — ALLERGEN FIRE ANT: I070-IgE Fire Ant (Invicta): 9.42 kU/L — AB

## 2020-01-13 LAB — TRYPTASE: Tryptase: 7.1 ug/L (ref 2.2–13.2)

## 2020-04-18 ENCOUNTER — Ambulatory Visit: Payer: Medicare Other | Admitting: Allergy

## 2020-04-18 ENCOUNTER — Other Ambulatory Visit: Payer: Self-pay

## 2020-04-18 ENCOUNTER — Encounter: Payer: Self-pay | Admitting: Allergy

## 2020-04-18 VITALS — BP 142/78 | HR 67 | Temp 97.9°F | Resp 16

## 2020-04-18 DIAGNOSIS — T7840XD Allergy, unspecified, subsequent encounter: Secondary | ICD-10-CM | POA: Diagnosis not present

## 2020-04-18 DIAGNOSIS — L509 Urticaria, unspecified: Secondary | ICD-10-CM | POA: Diagnosis not present

## 2020-04-18 NOTE — Patient Instructions (Addendum)
Allergic reactions Urticaria (Hives)  - fish IgE panel, fire ant IgE, alpha gal IgE panel  and chronic urticaria index (test for autoimmune hives) were positive on testing.  This can indicate that ingestion of fish and red meats could result in an IgE mediated allergic reaction.  This also could occur with a bite from fire ant.  Other chronic urticaria index indicates that he makes an antibody that can target his allergy cells to produce spontaneous hives and/or swelling. -At this point in time it will be difficult to pinpoint a specific trigger of his reaction over the summer however any of the above could have produced his symptoms. - Would continue to avoid fish, red meat, and fire ants stings.  His alpha gal IgE level however was low and we will get him scheduled for red meat challenge.  Advised to plan to be in the office for at least 6 to 8 hours and to bring in a serving portion of red meat cooked that he would like to eat.  He has been advised to hold his antihistamines for at least 3 days prior.  - given you have not had any reactions since your previous visit, would recommend stopping Pepcid 20mg  daily for 2 weeks, and if no further reactions, stopping Allegra 180mg  daily at that time.   - If you do develop a reaction after stopping either Pepcid or Allegra, please restart the medication previously stopped and continue taking daily.  - should significant symptoms recur or new symptoms occur, a journal is to be kept recording any foods eaten, beverages consumed, medications taken, activities performed, and environmental conditions within a 6 hour time period prior to the onset of symptoms. For any symptoms concerning for anaphylaxis, epinephrine is to be administered and 911 is to be called immediately.   Follow-up for red meat challenge and for routine visit in 6 months

## 2020-04-18 NOTE — Progress Notes (Signed)
Follow-up Note  RE: Dennis Diaz MRN: [id number] DOB: 26-Aug-1945 Date of Office Visit: 04/18/2020   History of present illness: Dennis Diaz is a 74 y.o. male presenting today for follow-up of allergic reactions and urticaria.  He was last seen in the office on 01/06/2019 myself.  He states he has been taking Allegra and Pepcid daily since then.  He is also been avoiding fish and red meat products.  He has not had any further hives or reaction since then.  He has access to an epinephrine device that he has not needed to use.  He would really like to be able to eat red meats if possible.  He has some upcoming trips states one of them is to his city with increased accounts that he would love to be able to enjoy.  He is also not had any fire ant stings either.     Review of systems: Review of Systems  Constitutional: Negative.   HENT: Negative.   Eyes: Negative.   Respiratory: Negative.   Cardiovascular: Negative.   Gastrointestinal: Negative.   Musculoskeletal: Negative.   Skin: Negative.   Neurological: Negative.     All other systems negative unless noted above in HPI  Past medical/social/surgical/family history have been reviewed and are unchanged unless specifically indicated below.  No changes  Medication List: Current Outpatient Medications  Medication Sig Dispense Refill  . Clobetasol Prop Emollient Base (CLOBETASOL PROPIONATE E) 0.05 % emollient cream Apply 1 application topically 2 (two) times daily. 60 g 9  . EPINEPHrine 0.3 mg/0.3 mL IJ SOAJ injection Inject 0.3 mg into the muscle as directed.    . famotidine (PEPCID) 20 MG tablet Take 20 mg by mouth 2 (two) times daily.    . fexofenadine (ALLEGRA) 180 MG tablet Take 180 mg by mouth daily.     Current Facility-Administered Medications  Medication Dose Route Frequency Provider Last Rate Last Admin  . 0.9 %  sodium chloride infusion  500 mL Intravenous Continuous Doran Stabler, MD         Known medication  allergies: Allergies  Allergen Reactions  . Fire [employer]   . Fish Allergy Hives    Some fish protiens he is allergic to like hering and mahi mahi- a schromboid issue per pt. - fresh fish is okay      Physical examination: Blood pressure (!) 142/78, pulse 67, temperature 97.9 F (36.6 C), temperature source Temporal, resp. rate 16, SpO2 96 %.  General: Alert, interactive, in no acute distress. HEENT: PERRLA, TMs pearly gray, turbinates non-edematous without discharge, post-pharynx non erythematous. Neck: Supple without lymphadenopathy. Lungs: Clear to auscultation without wheezing, rhonchi or rales. {no increased work of breathing. CV: Normal S1, S2 without murmurs. Abdomen: Nondistended, nontender. Skin: Warm and dry, without lesions or rashes. Extremities:  No clubbing, cyanosis or edema. Neuro:   Grossly intact.  Diagnositics/Labs: Labs:  Component     Latest Ref Rng & Units 01/06/2020  WBC     3.4 - 10.8 x10E3/uL 6.5  RBC     4.14 - 5.80 x10E6/uL 5.65  Hemoglobin     13.0 - 17.7 g/dL 16.3  HCT     37.5 - 51.0 % 49.4  MCV     79 - 97 fL 87  MCH     26.6 - 33.0 pg 28.8  MCHC     31 - 35 g/dL 33.0  RDW     11.6 - 15.4 % 13.0  Platelets  150 - 450 x10E3/uL 226  Neutrophils     Not Estab. % 72  Lymphs     Not Estab. % 18  Monocytes     Not Estab. % 5  Eos     Not Estab. % 3  Basos     Not Estab. % 1  NEUT#     1.40 - 7.00 x10E3/uL 4.7  Lymphocyte #     0 - 3 x10E3/uL 1.2  Monocytes Absolute     0 - 0 x10E3/uL 0.3  EOS (ABSOLUTE)     0.0 - 0.4 x10E3/uL 0.2  Basophils Absolute     0 - 0 x10E3/uL 0.0  Immature Granulocytes     Not Estab. % 1  Immature Grans (Abs)     0.0 - 0.1 x10E3/uL 0.0  Glucose     65 - 99 mg/dL 117 (H)  BUN     8 - 27 mg/dL 14  Creatinine     0.76 - 1.27 mg/dL 1.07  GFR, Est Non African American     >59 mL/min/1.73 68  GFR, Est African American     >59 mL/min/1.73 79  BUN/Creatinine Ratio     10 - 24 13  Sodium     134 -  144 mmol/L 142  Potassium     3.5 - 5.2 mmol/L 4.8  Chloride     96 - 106 mmol/L 102  CO2     20 - 29 mmol/L 24  Calcium     8.6 - 10.2 mg/dL 9.6  Total Protein     6.0 - 8.5 g/dL 7.3  Albumin     3.7 - 4.7 g/dL 4.8 (H)  Globulin, Total     1.5 - 4.5 g/dL 2.5  Albumin/Globulin Ratio     1.2 - 2.2 1.9  Total Bilirubin     0.0 - 1.2 mg/dL 0.3  Alkaline Phosphatase     48 - 121 IU/L 86  AST     0 - 40 IU/L 23  ALT     0 - 44 IU/L 26  Class Description Allergens      Comment  Codfish IgE     Class 0/I kU/L 0.27 (A)  Halibut IgE     Class II kU/L 0.56 (A)  Allergen Walley Pike IgE     Class I kU/L 0.37 (A)  Tuna     Class 0/I kU/L 0.21 (A)  Allergen Salmon IgE     Class II kU/L 0.60 (A)  Allergen Mackerel IgE     Class I kU/L 0.32 (A)  Allergen Trout IgE     Class II kU/L 0.75 (A)  Beef (Bos spp) IgE     <0.35 kU/L 0.37 (H)  Class Interpretation      1  Lamb/Mutton (Ovis spp) IgE     <0.35 kU/L 0.19  Class Interpretation      0/1  Pork (Sus spp) IgE     <0.35 kU/L 0.20  Class Interpretation      0/1  Alpha Gal IgE*     <0.10 kU/L 0.17 (H)  Thyroperoxidase Ab SerPl-aCnc     0 - 34 IU/mL <9  Thyroglobulin Antibody     0.0 - 0.9 IU/mL <1.0  cu index     <10 15.3 (H)  Tryptase     2.2 - 13.2 ug/L 7.1  [id number] Fire Ant (Invicta)     Class IV kU/L 9.42 (A)     Assessment and plan:  Allergic reactions Urticaria (Hives)  - fish IgE panel, fire ant IgE, alpha gal IgE panel  and chronic urticaria index (test for autoimmune hives) were positive on testing.  This can indicate that ingestion of fish and red meats could result in an IgE mediated allergic reaction.  This also could occur with a bite from fire ant.  Other chronic urticaria index indicates that he makes an antibody that can target his allergy cells to produce spontaneous hives and/or swelling. -At this point in time it will be difficult to pinpoint a specific trigger of his reaction over the  summer however any of the above could have produced his symptoms. - Would continue to avoid fish, red meat, and fire ants stings.  His alpha gal IgE level however was low and we will get him scheduled for red meat challenge.  Advised to plan to be in the office for at least 6 to 8 hours and to bring in a serving portion of red meat cooked that he would like to eat.  He has been advised to hold his antihistamines for at least 3 days prior.  - given you have not had any reactions since your previous visit, would recommend stopping Pepcid 20mg  daily for 2 weeks, and if no further reactions, stopping Allegra 180mg  daily at that time.   - If you do develop a reaction after stopping either Pepcid or Allegra, please restart the medication previously stopped and continue taking daily.  - should significant symptoms recur or new symptoms occur, a journal is to be kept recording any foods eaten, beverages consumed, medications taken, activities performed, and environmental conditions within a 6 hour time period prior to the onset of symptoms. For any symptoms concerning for anaphylaxis, epinephrine is to be administered and 911 is to be called immediately.   Follow-up for red meat challenge and for routine visit in 6 months  I appreciate the opportunity to take part in Bruin's care. Please do not hesitate to contact me with questions.  Sincerely,   Prudy Feeler, MD Allergy/Immunology [facility] [place]

## 2020-04-24 NOTE — Progress Notes (Addendum)
[address] [place] [address] Dept: [phone]  FOLLOW UP NOTE  Patient ID: Dennis Diaz, male    DOB: 1946-04-04  Age: 74 y.o. MRN: [id number] Date of Office Visit: 04/25/2020  Assessment  Chief Complaint: Food/Drug Challenge  HPI Dennis Diaz is a 74 year old male who presents to the clinic for a food challenge to red meat. He was last seen in this clinic on 04/18/2020 by Dr. 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 At today's visit, he reports that he is feeling well and has not taken any antihistamines for the last 3 days. He denies cardiopulmonary, integumentary, and gastrointestinal symptoms. He reports that he has eaten beef products for years without issue, however, on the morning of 12/23/2019 he experienced symptoms including hives, hypotension, vision change, sweating, diarrhea, and hypothermia for which he was treated first by EMS and later at the ED. He did require an epinephrine injection at that time. He reports that he was bitten several times by fire ants about 2-3 weeks prior to this incident. He also reports that he has had intermittent urticarial breakouts occurring immediately after eating fish, however, he was able to continue to eat the same fish without hives occurring. He has been avoiding fish and red meat since his initial visit on 01/06/2020, in addition to taking famotidine and Allegra, and has not experienced any hives. His current medications are listed in the chart.   Drug Allergies:  Allergies  Allergen Reactions   Fire Ant    Fish Allergy Hives    Some fish protiens he is allergic to like hering and [employer]	[place]- a schromboid issue per pt. - fresh fish is okay     Physical Exam: BP 124/76    Pulse 78    Temp 98.1 F (36.7 C) (Tympanic)    Resp 16    SpO2 97%    Physical Exam Vitals reviewed.  Constitutional:      Appearance: Normal appearance.  HENT:     Head: Normocephalic and  atraumatic.     Right Ear: Tympanic membrane normal.     Left Ear: Tympanic membrane normal.     Nose:     Comments: Bilateral nares normal. Pharynx normal. Ears normal. Eyes normal.    Mouth/Throat:     Pharynx: Oropharynx is clear.  Eyes:     Conjunctiva/sclera: Conjunctivae normal.  Cardiovascular:     Rate and Rhythm: Normal rate and regular rhythm.     Heart sounds: No murmur heard.   Pulmonary:     Effort: Pulmonary effort is normal.     Breath sounds: Normal breath sounds.     Comments: Lungs clear to auscultation Musculoskeletal:        General: Normal range of motion.     Cervical back: Normal range of motion and neck supple.  Skin:    General: Skin is warm and dry.  Neurological:     Mental Status: He is alert and oriented to person, place, and time.  Psychiatric:        Mood and Affect: Mood normal.        Behavior: Behavior normal.        Thought Content: Thought content normal.        Judgment: Judgment normal.      Procedure note: Written consent signed Open graded beef oral challenge: The patient was able to tolerate the challenge today without adverse signs or  symptoms. Vital signs were stable throughout the challenge and observation period. He received two doses separated by 30 minutes, each of which was separated by vitals and a brief physical exam. He received the following doses: 53 grams of steak and 50 grams of steak. He was monitored for 4 hours following the last dose.   The patient was able to tolerate the open graded oral challenge today without adverse signs or symptoms. Therefore, he has the same risk of systemic reaction associated with the consumption of beef as the general population.  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  Therefore, he has the same risk of systemic reaction associated with the consumption of beef as the general population.  - Do not consume any beef for the next 24 hours. - Monitor for allergic symptoms such as rash, wheezing, diarrhea, swelling, and vomiting for the next 24 hours. If severe symptoms occur, treat with EpiPen injection and call 911. For less severe symptoms treat with Benadryl 50 mg teaspoonfuls every 4 hours and call the clinic.  - If no allergic symptoms are evident, reintroduce beef  into the diet, 1-2 servings a day. If he develops an allergic reaction to beef, record what was eaten the amount eaten, preparation method, time from ingestion to reaction, and symptoms.  - A nurse from this clinic will call you later this afternoon to check if you are experiencing any symptoms.   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  In case of an allergic reaction, take Benadryl 50 mg every 4 hours, and if life-threatening symptoms occur, inject with EpiPen 0.3 mg.  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  In case of an allergic reaction, take Benadryl 50 mg every 4 hours, and if life-threatening symptoms occur, inject with EpiPen 0.3 mg.  Call the clinic if this treatment plan is not working well for you  Follow up in 6 months or sooner if needed.   Return in about 6 months (around 10/24/2020), or if symptoms worsen or fail to improve.    Thank you for the opportunity to care for this patient.  Please do not hesitate to contact me with questions.  Gareth Morgan, FNP [facility]  ________________________________________________  I have provided oversight concerning Dennis Diaz's evaluation and treatment of this patient's health issues addressed during today's encounter.  I agree with the assessment and therapeutic plan as outlined in the note.   Signed,   R Edgar Frisk, MD

## 2020-04-24 NOTE — Patient Instructions (Addendum)
Office food challenge to red meat Dennis Diaz was able to tolerate the beef food challenge today at the office without adverse signs or symptoms of an allergic reaction. Therefore, he has the same risk of systemic reaction associated with the consumption of beef as the general population.  - Do not consume any beef for the next 24 hours. - Monitor for allergic symptoms such as rash, wheezing, diarrhea, swelling, and vomiting for the next 24 hours. If severe symptoms occur, treat with EpiPen injection and call 911. For less severe symptoms treat with Benadryl 50 mg teaspoonfuls every 4 hours and call the clinic.  - If no allergic symptoms are evident, reintroduce beef  into the diet, 1-2 servings a day. If he develops an allergic reaction to beef, record what was eaten the amount eaten, preparation method, time from ingestion to reaction, and symptoms.  - A nurse from this clinic will call you later this afternoon to check if you are experiencing any symptoms.   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  In case of an allergic reaction, take Benadryl 50 mg every 4 hours, and if life-threatening symptoms occur, inject with EpiPen 0.3 mg.  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  In case of an allergic reaction, take Benadryl 50 mg every 4 hours, and if life-threatening symptoms occur, inject with EpiPen 0.3 mg.  Call the clinic if this treatment plan is not working well for you  Follow up in 6 months or sooner if needed.

## 2020-04-25 ENCOUNTER — Other Ambulatory Visit: Payer: Self-pay

## 2020-04-25 ENCOUNTER — Ambulatory Visit (INDEPENDENT_AMBULATORY_CARE_PROVIDER_SITE_OTHER): Payer: Medicare Other | Admitting: Family Medicine

## 2020-04-25 ENCOUNTER — Encounter: Payer: Self-pay | Admitting: Family Medicine

## 2020-04-25 VITALS — BP 124/76 | HR 78 | Temp 98.1°F | Resp 16

## 2020-04-25 DIAGNOSIS — T7800XD Anaphylactic reaction due to unspecified food, subsequent encounter: Secondary | ICD-10-CM | POA: Diagnosis not present

## 2020-04-25 DIAGNOSIS — T7800XA Anaphylactic reaction due to unspecified food, initial encounter: Secondary | ICD-10-CM | POA: Insufficient documentation

## 2020-05-15 ENCOUNTER — Other Ambulatory Visit (HOSPITAL_BASED_OUTPATIENT_CLINIC_OR_DEPARTMENT_OTHER): Payer: Self-pay | Admitting: Internal Medicine

## 2020-05-15 ENCOUNTER — Ambulatory Visit: Payer: Medicare Other | Attending: Internal Medicine

## 2020-05-15 DIAGNOSIS — Z23 Encounter for immunization: Secondary | ICD-10-CM

## 2020-05-15 MED FILL — PFIZER-BIONTECH COVID-19 VA: 30 | 1 days supply | Qty: 0 | Fill #0

## 2020-05-15 NOTE — Progress Notes (Signed)
   Covid-19 Vaccination Clinic  Name:  Dennis Diaz    MRN: [id number] DOB: 06-Jun-1946  05/15/2020  [name] was observed post Covid-19 immunization for 15 minutes without incident. He was provided with Vaccine Information Sheet and instruction to access the V-Safe system.   [name] was instructed to call 911 with any severe reactions post vaccine: Marland Kitchen Difficulty breathing  . Swelling of face and throat  . A fast heartbeat  . A bad rash all over body  . Dizziness and weakness   Immunizations Administered    Name Date Dose VIS Date Route   Pfizer COVID-19 Vaccine 05/15/2020  9:27 AM 0.3 mL 04/18/2020 Intramuscular   Manufacturer: [place]   Lot: [id number]   NDC: [address]-7

## 2020-06-11 ENCOUNTER — Encounter: Payer: Medicare Other | Admitting: Family Medicine

## 2020-06-13 ENCOUNTER — Ambulatory Visit: Payer: Medicare Other | Admitting: Family Medicine

## 2020-06-13 ENCOUNTER — Other Ambulatory Visit: Payer: Self-pay

## 2020-06-13 ENCOUNTER — Encounter: Payer: Self-pay | Admitting: Family Medicine

## 2020-06-13 VITALS — BP 124/74 | HR 62 | Temp 97.5°F | Resp 16 | Ht 73.0 in | Wt 219.4 lb

## 2020-06-13 DIAGNOSIS — T7803XD Anaphylactic reaction due to other fish, subsequent encounter: Secondary | ICD-10-CM

## 2020-06-13 NOTE — Patient Instructions (Addendum)
Office food challenge to flounder Dennis Diaz was able to tolerate the flounder food challenge today at the office without adverse signs or symptoms of an allergic reaction. Therefore, he has the same risk of systemic reaction associated with the consumption of flounder as the general population.  - Do not consume any fish  for the next 24 hours. - Monitor for allergic symptoms such as rash, wheezing, diarrhea, swelling, and vomiting for the next 24 hours. If severe symptoms occur, treat with EpiPen injection and call 911. For less severe symptoms treat with Benadryl 50 mg every 4 hours and call the clinic.  - If no allergic symptoms are evident, reintroduce flounder into the diet. You may slowly add other fish into your diet one at a time.  If you develop an allergic reaction to flounder or other fish , record what was eaten the amount eaten, preparation method, time from ingestion to reaction, and symptoms.   Call the clinic if this treatment plan is not working well for you  Follow up in 1 year or sooner if needed.

## 2020-06-13 NOTE — Progress Notes (Signed)
[address] [place] [address] Dept: [phone]  FOLLOW UP NOTE  Patient ID: Dennis Diaz, male    DOB: 1946/06/03  Age: 74 y.o. MRN: [id number] Date of Office Visit: 06/13/2020  Assessment  Chief Complaint: Food/Drug Challenge  HPI Dennis Diaz is a 74 year old male who presents to the clinic for a food challenge to flounder.  He was last seen in this clinic on 04/25/2020 for a successful p.o. challenge.  Prior to that he was seen in this clinic on 04/18/2020 by Dr. 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  Notation of beef allergy was removed after successful beef challenge in the clinic.  On 01/06/2020 he had negative skin testing to the fin fish panel Dennis Diaz slightly elevated serum IgE to the fish panel.  At today's visit he reports he is feeling well with no cardiopulmonary, integumentary, or gastrointestinal symptoms.  His current medications are listed in the chart.   Drug Allergies:  Allergies  Allergen Reactions  . Fire [employer]   . Fish Allergy Hives    Some fish protiens he is allergic to like hering and [place]- a schromboid issue per pt. - fresh fish is okay     Physical Exam: BP 124/74 (BP Location: Left Arm, Patient Position: Sitting, Cuff Size: Normal)   Pulse 62   Temp (!) 97.5 F (36.4 C) (Tympanic)   Resp 16   Ht 6\' 1"  (1.854 m)   Wt 219 lb 5.7 oz (99.5 kg)   SpO2 98%   BMI 28.94 kg/m    Physical Exam Vitals reviewed.  Constitutional:      Appearance: Normal appearance.  HENT:     Head: Normocephalic and atraumatic.     Right Ear: Tympanic membrane normal.     Left Ear: Tympanic membrane normal.     Nose:     Comments: Bilateral nares slightly erythematous with clear nasal drainage noted.  Pharynx normal.  Ears normal.  Eyes normal.    Mouth/Throat:     Pharynx: Oropharynx is clear.  Eyes:     Conjunctiva/sclera: Conjunctivae normal.  Cardiovascular:     Rate and Rhythm: Normal rate and regular rhythm.     Heart sounds:  Normal heart sounds. No murmur heard.   Pulmonary:     Effort: Pulmonary effort is normal.     Breath sounds: Normal breath sounds.     Comments: Lungs clear to auscultation Musculoskeletal:        General: Normal range of motion.     Cervical back: Normal range of motion and neck supple.  Skin:    General: Skin is warm and dry.  Neurological:     Mental Status: He is alert and oriented to person, place, and time.  Psychiatric:        Mood and Affect: Mood normal.        Behavior: Behavior normal.        Thought Content: Thought content normal.        Judgment: Judgment normal.     Procedure note: Written consent obtained Open graded Flounder oral challenge: The patient was able to tolerate the challenge today without adverse signs or symptoms. Vital signs were stable throughout the challenge and observation period. He received multiple doses separated by 20 minutes, each of which was separated by vitals and a brief physical exam. He received the following doses: lip rub, 0.1 gram, 0.3 gram, 0.7 gram, and 1.5 gram for a total of about  3 grams total. He was monitored for 60 minutes following the last dose.   The patient had negative skin prick tests to The finned fish panel and was able to tolerate the open graded oral challenge today without adverse signs or symptoms. Therefore, he has the same risk of systemic reaction associated with the consumption of flounder as the general population.  Assessment and Plan: 1. 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 Therefore, he has the same risk of systemic reaction associated with the consumption of flounder as the general population.  - Do not consume any fish  for the next 24 hours. - Monitor for allergic symptoms such as  rash, wheezing, diarrhea, swelling, and vomiting for the next 24 hours. If severe symptoms occur, treat with EpiPen injection and call 911. For less severe symptoms treat with Benadryl 50 mg every 4 hours and call the clinic.  - If no allergic symptoms are evident, reintroduce flounder into the diet. You may slowly add other fish into your diet one at a time.  If you develop an allergic reaction to flounder or other fish , record what was eaten the amount eaten, preparation method, time from ingestion to reaction, and symptoms.   Call the clinic if this treatment plan is not working well for you  Follow up in 1 year or sooner if needed.   Return in about 1 year (around 06/13/2021), or if symptoms worsen or fail to improve.    Thank you for the opportunity to care for this patient.  Please do not hesitate to contact me with questions.  Gareth Morgan, FNP [facility]

## 2020-06-15 ENCOUNTER — Other Ambulatory Visit: Payer: Self-pay | Admitting: Internal Medicine

## 2020-06-15 DIAGNOSIS — E785 Hyperlipidemia, unspecified: Secondary | ICD-10-CM

## 2020-07-12 ENCOUNTER — Ambulatory Visit
Admission: RE | Admit: 2020-07-12 | Discharge: 2020-07-12 | Disposition: A | Discharge status: Home / Self Care | Payer: No Typology Code available for payment source | Source: Ambulatory Visit | Attending: Internal Medicine | Admitting: Internal Medicine

## 2020-07-12 DIAGNOSIS — E785 Hyperlipidemia, unspecified: Secondary | ICD-10-CM

## 2020-10-10 ENCOUNTER — Other Ambulatory Visit: Payer: Self-pay

## 2020-10-10 ENCOUNTER — Encounter: Payer: Self-pay | Admitting: Dermatology

## 2020-10-10 ENCOUNTER — Ambulatory Visit: Payer: Medicare Other | Admitting: Dermatology

## 2020-10-10 DIAGNOSIS — L821 Other seborrheic keratosis: Secondary | ICD-10-CM

## 2020-10-10 DIAGNOSIS — Z1283 Encounter for screening for malignant neoplasm of skin: Secondary | ICD-10-CM

## 2020-10-10 DIAGNOSIS — D3615 Benign neoplasm of peripheral nerves and autonomic nervous system of abdomen: Secondary | ICD-10-CM

## 2020-10-10 DIAGNOSIS — D485 Neoplasm of uncertain behavior of skin: Secondary | ICD-10-CM

## 2020-10-10 DIAGNOSIS — D3610 Benign neoplasm of peripheral nerves and autonomic nervous system, unspecified: Secondary | ICD-10-CM

## 2020-10-10 NOTE — Patient Instructions (Signed)

## 2020-10-18 ENCOUNTER — Ambulatory Visit: Payer: Medicare Other

## 2020-10-18 ENCOUNTER — Ambulatory Visit: Payer: Medicare Other | Attending: Internal Medicine

## 2020-10-18 DIAGNOSIS — Z23 Encounter for immunization: Secondary | ICD-10-CM

## 2020-10-18 NOTE — Progress Notes (Signed)
   Covid-19 Vaccination Clinic  Name:  Dennis Diaz    MRN: [id number] DOB: [birth year]  10/18/2020  [name] was observed post Covid-19 immunization for 15 minutes without incident. He was provided with Vaccine Information Sheet and instruction to access the V-Safe system.   [name] was instructed to call 911 with any severe reactions post vaccine: Marland Kitchen Difficulty breathing  . Swelling of face and throat  . A fast heartbeat  . A bad rash all over body  . Dizziness and weakness   Immunizations Administered    Name Date Dose VIS Date Route   PFIZER Comrnaty(Gray TOP) Covid-19 Vaccine 10/18/2020 11:34 AM 0.3 mL 06/07/2020 Intramuscular   Manufacturer: [place]   Lot: [id number]   NDC: [phone]

## 2020-10-20 ENCOUNTER — Encounter: Payer: Self-pay | Admitting: Dermatology

## 2020-10-22 ENCOUNTER — Other Ambulatory Visit (HOSPITAL_BASED_OUTPATIENT_CLINIC_OR_DEPARTMENT_OTHER): Payer: Self-pay

## 2020-10-22 MED ORDER — PFIZER-BIONT COVID-19 VAC-TRIS 30 MCG/0.3ML IM SUSP
INTRAMUSCULAR | 0 refills | Status: AC
  Filled 2020-10-22: qty 0.3, 1d supply, fill #0

## 2020-10-23 ENCOUNTER — Encounter: Payer: Self-pay | Admitting: Dermatology

## 2020-10-23 MED ORDER — CLOBETASOL PROP EMOLLIENT BASE 0.05 % EX CREA: 1.0000 "application " | TOPICAL_CREAM | Freq: Two times a day (BID) | CUTANEOUS | 7 refills | Status: AC

## 2021-01-10 ENCOUNTER — Encounter (INDEPENDENT_AMBULATORY_CARE_PROVIDER_SITE_OTHER): Payer: Self-pay | Admitting: Ophthalmology

## 2021-01-10 ENCOUNTER — Ambulatory Visit (INDEPENDENT_AMBULATORY_CARE_PROVIDER_SITE_OTHER): Payer: Medicare Other | Admitting: Ophthalmology

## 2021-01-10 ENCOUNTER — Other Ambulatory Visit: Payer: Self-pay

## 2021-01-10 DIAGNOSIS — H33321 Round hole, right eye: Secondary | ICD-10-CM | POA: Diagnosis not present

## 2021-01-10 DIAGNOSIS — H33322 Round hole, left eye: Secondary | ICD-10-CM

## 2021-01-10 NOTE — Assessment & Plan Note (Signed)
The nature of retinal tears was discussed with the patient. Treatment options including laser retinopexy versus cryotherapy was discussed with the patient as well, as possible side effects including possible failure to prevent retinal detachment.  Other tears in other meridians or at the same site of repair could develop as the vitreous gel continues to contract and exert traction over time. All the patient's questions were answered. An informational note  was given to the patient.  OD with large atrophic hole anterior to hyperpigmented region.  Thus traction is nearby and should undergo laser retinopexy right eye

## 2021-01-10 NOTE — Assessment & Plan Note (Signed)
Small atrophic hole 11:00 meridian with a history of retinal breaks in the right eye.  Early pigmentation suggest the eye is trying to heal this spontaneously nonetheless we will recommend nonurgent laser retinopexy left eye

## 2021-01-10 NOTE — Progress Notes (Signed)
01/10/2021     CHIEF COMPLAINT Patient presents for Retina Evaluation (NP- Retinal Tears OU- Referred by S. Groat/Pt states, "I am not having any issues that I am able to tell. I went for my regular eye exam and Dr. Katy Fitch saw some things that I need to have checked out."//)   HISTORY OF PRESENT ILLNESS: Dennis Diaz is a 75 y.o. male who presents to the clinic today for:   HPI     Retina Evaluation           Laterality: both eyes   Onset: 1 day ago   Duration: 1 day   Associated Symptoms: Negative for Flashes, Floaters and Distortion   Comments: NP- Retinal Tears OU- Referred by S. Groat Pt states, "I am not having any issues that I am able to tell. I went for my regular eye exam and Dr. Katy Fitch saw some things that I need to have checked out."         Last edited by Kendra Opitz, COA on 01/10/2021  3:06 PM.      Referring physician: Debbra Riding, MD [address] [place],  [place] [address]  HISTORICAL INFORMATION:   Selected notes from the MEDICAL RECORD NUMBER    Lab Results  Component Value Date   HGBA1C 5.8 (H) 04/19/2015     CURRENT MEDICATIONS: No current outpatient medications on file. (Ophthalmic Drugs)   No current facility-administered medications for this visit. (Ophthalmic Drugs)   Current Outpatient Medications (Other)  Medication Sig   atorvastatin (LIPITOR) 40 MG tablet Take 40 mg by mouth daily.   Clobetasol Prop Emollient Base (CLOBETASOL PROPIONATE E) 0.05 % emollient cream Apply 1 application topically 2 (two) times daily.   COVID-19 mRNA Vac-TriS, Pfizer, (PFIZER-BIONT COVID-19 VAC-TRIS) SUSP injection Inject into the muscle.   Current Facility-Administered Medications (Other)  Medication Route   0.9 %  sodium chloride infusion Intravenous      REVIEW OF SYSTEMS:    ALLERGIES Allergies  Allergen Reactions   Fire Ant    Fish Allergy Hives    Some fish protiens he is allergic to like hering and [employer]	[place]- a  schromboid issue per pt. - fresh fish is okay     PAST MEDICAL HISTORY Past Medical History:  Diagnosis Date   Angio-edema    Diverticulitis    ED (erectile dysfunction)    Kidney stones    2 episodes in 40 years    Urticaria    Past Surgical History:  Procedure Laterality Date   COLONOSCOPY     POLYPECTOMY     root canal     and redo    FAMILY HISTORY Family History  Problem Relation Age of Onset   Alzheimer's disease Mother    Heart disease Father 58       CABG   Diabetes Father    Colon cancer Neg Hx    Colon polyps Neg Hx    Rectal cancer Neg Hx    Stomach cancer Neg Hx    Angioedema Neg Hx    Asthma Neg Hx    Atopy Neg Hx    Eczema Neg Hx    Immunodeficiency Neg Hx    Urticaria Neg Hx    Allergic rhinitis Neg Hx     SOCIAL HISTORY Social History   Tobacco Use   Smoking status: Never   Smokeless tobacco: Never  Vaping Use   Vaping Use: Never used  Substance Use Topics  Alcohol use: Yes    Alcohol/week: 4.0 standard drinks    Types: 4 Glasses of wine per week   Drug use: No         OPHTHALMIC EXAM:  Base Eye Exam     Visual Acuity (ETDRS)       Right Left   Dist cc 20/20 -1 20/20         Tonometry (Tonopen, 3:08 PM)       Right Left   Pressure 12 10         Pupils       Pupils Dark Light Shape React APD   Right PERRL 3 2 Round Brisk None   Left PERRL 3 2 Round Brisk None         Visual Fields (Counting fingers)       Left Right    Full Full         Extraocular Movement       Right Left    Full Full         Neuro/Psych     Oriented x3: Yes         Dilation     Both eyes: 1.0% Mydriacyl, 2.5% Phenylephrine @ 3:09 PM           Slit Lamp and Fundus Exam     External Exam       Right Left   External Normal Normal         Slit Lamp Exam       Right Left   Lids/Lashes Normal Normal   Conjunctiva/Sclera White and quiet White and quiet   Cornea Clear Clear   Anterior Chamber Deep and  quiet Deep and quiet   Iris Round and reactive Round and reactive   Lens 2+ Nuclear sclerosis, Trace Cortical cataract 2+ Nuclear sclerosis   Anterior Vitreous Normal Normal         Fundus Exam       Right Left   Posterior Vitreous Posterior vitreous detachment Posterior vitreous detachment   Disc Normal Normal   C/D Ratio 0.2 0.2   Macula Normal Normal   Vessels Normal Normal   Periphery Retinal hole 3:00, anterior to hyperpigmented region.  Good laser retinopexy 5:00 meridian from prior hole 1130 pigmented retinal hole             IMAGING AND PROCEDURES  Imaging and Procedures for 01/10/21  Color Fundus Photography Optos - OU - Both Eyes       Right Eye Progression has been stable. Disc findings include normal observations. Macula : normal observations. Vessels : normal observations.   Left Eye Progression has been stable. Disc findings include normal observations. Macula : normal observations. Vessels : normal observations. Periphery : normal observations.   Notes Retinal hole, anterior to the hyperpigmented region at 230 meridian nasally OD.  OS retinal breaks seen clinically with pigmentation small hole 11:00 meridian not visualized on color fundus photography     Repair Retinal Breaks, Laser - OD - Right Eye       Tear locations include nasal.   Time Out Confirmed correct patient, procedure, site, and patient consented.   Anesthesia Topical anesthesia was used. Anesthetic medications included Proparacaine 0.5%.   Laser Information The type of laser was diode. Color was yellow. The duration in seconds was 0.03. The spot size was 390 microns. Laser power was 300. Total spots was 206.   Post-op The patient tolerated the procedure well. There were no complications.  The patient received written and verbal post procedure care education.              ASSESSMENT/PLAN:  Retinal hole, left Small atrophic hole 11:00 meridian with a history of retinal  breaks in the right eye.  Early pigmentation suggest the eye is trying to heal this spontaneously nonetheless we will recommend nonurgent laser retinopexy left eye  Retinal hole of right eye The nature of retinal tears was discussed with the patient. Treatment options including laser retinopexy versus cryotherapy was discussed with the patient as well, as possible side effects including possible failure to prevent retinal detachment.  Other tears in other meridians or at the same site of repair could develop as the vitreous gel continues to contract and exert traction over time. All the patient's questions were answered. An informational note  was given to the patient.  OD with large atrophic hole anterior to hyperpigmented region.  Thus traction is nearby and should undergo laser retinopexy right eye     ICD-10-CM   1. Retinal hole of right eye  H33.321 Color Fundus Photography Optos - OU - Both Eyes    Repair Retinal Breaks, Laser - OD - Right Eye    2. Retinal hole, left  H33.322 Color Fundus Photography Optos - OU - Both Eyes      1.  Benefits reviewed.  Laser retinopexy applied today to the retinal breaks 230 3:00 meridian anteriorly OD.  2.  We will need to schedule laser retinopexy for superonasal small atrophic hole with early pigmentation in 7 to 10 days  3.  Ophthalmic Meds Ordered this visit:  No orders of the defined types were placed in this encounter.      Return for dilate, OS, RETINOPEXY.  There are no Patient Instructions on file for this visit.   Explained the diagnoses, plan, and follow up with the patient and they expressed understanding.  Patient expressed understanding of the importance of proper follow up care.   Clent Demark Ikesha Siller M.D. Diseases & Surgery of the Retina and Vitreous [facility] 01/10/21     Abbreviations: M myopia (nearsighted); A astigmatism; H hyperopia (farsighted); P presbyopia; Mrx spectacle prescription;  CTL contact  lenses; OD right eye; OS left eye; OU both eyes  XT exotropia; ET esotropia; PEK punctate epithelial keratitis; PEE punctate epithelial erosions; DES dry eye syndrome; MGD meibomian gland dysfunction; ATs artificial tears; PFAT's preservative free artificial tears; Haynes nuclear sclerotic cataract; PSC posterior subcapsular cataract; ERM epi-retinal membrane; PVD posterior vitreous detachment; RD retinal detachment; DM diabetes mellitus; DR diabetic retinopathy; NPDR non-proliferative diabetic retinopathy; PDR proliferative diabetic retinopathy; CSME clinically significant macular edema; DME diabetic macular edema; dbh dot blot hemorrhages; CWS cotton wool spot; POAG primary open angle glaucoma; C/D cup-to-disc ratio; HVF humphrey visual field; GVF goldmann visual field; OCT optical coherence tomography; IOP intraocular pressure; BRVO Branch retinal vein occlusion; CRVO central retinal vein occlusion; CRAO central retinal artery occlusion; BRAO branch retinal artery occlusion; RT retinal tear; SB scleral buckle; PPV pars plana vitrectomy; VH Vitreous hemorrhage; PRP panretinal laser photocoagulation; IVK intravitreal kenalog; VMT vitreomacular traction; MH Macular hole;  NVD neovascularization of the disc; NVE neovascularization elsewhere; AREDS age related eye disease study; ARMD age related macular degeneration; POAG primary open angle glaucoma; EBMD epithelial/anterior basement membrane dystrophy; ACIOL anterior chamber intraocular lens; IOL intraocular lens; PCIOL posterior chamber intraocular lens; Phaco/IOL phacoemulsification with intraocular lens placement; PRK photorefractive keratectomy; LASIK laser assisted in situ keratomileusis; HTN hypertension; DM diabetes  mellitus; COPD chronic obstructive pulmonary disease

## 2021-01-15 ENCOUNTER — Encounter (INDEPENDENT_AMBULATORY_CARE_PROVIDER_SITE_OTHER): Payer: Medicare Other | Admitting: Ophthalmology

## 2021-01-15 ENCOUNTER — Encounter (INDEPENDENT_AMBULATORY_CARE_PROVIDER_SITE_OTHER): Payer: Self-pay | Admitting: Ophthalmology

## 2021-01-15 ENCOUNTER — Ambulatory Visit (INDEPENDENT_AMBULATORY_CARE_PROVIDER_SITE_OTHER): Payer: Medicare Other | Admitting: Ophthalmology

## 2021-01-15 DIAGNOSIS — H33322 Round hole, left eye: Secondary | ICD-10-CM | POA: Diagnosis not present

## 2021-01-15 DIAGNOSIS — H33321 Round hole, right eye: Secondary | ICD-10-CM

## 2021-01-15 NOTE — Assessment & Plan Note (Signed)
5 days status post laser retinopexy no complications

## 2021-01-15 NOTE — Progress Notes (Signed)
01/15/2021     CHIEF COMPLAINT Patient presents for Eye Problem   HISTORY OF PRESENT ILLNESS: Dennis Diaz is a 75 y.o. male who presents to the clinic today for:   HPI   5 days status post laser retinopexy right eye with no trauma no new symptoms.  OS with retinal hole found at the 10 and 10:30 position on examination, for laser retinopexy left eye today Last edited by Hurman Horn, MD on 01/15/2021  1:08 PM.      Referring physician: Sueanne Margarita, [place],  [place] [id number]  HISTORICAL INFORMATION:   Selected notes from the MEDICAL RECORD NUMBER    Lab Results  Component Value Date   HGBA1C 5.8 (H) 04/19/2015     CURRENT MEDICATIONS: No current outpatient medications on file. (Ophthalmic Drugs)   No current facility-administered medications for this visit. (Ophthalmic Drugs)   Current Outpatient Medications (Other)  Medication Sig   atorvastatin (LIPITOR) 40 MG tablet Take 40 mg by mouth daily.   Clobetasol Prop Emollient Base (CLOBETASOL PROPIONATE E) 0.05 % emollient cream Apply 1 application topically 2 (two) times daily.   COVID-19 mRNA Vac-TriS, Pfizer, (PFIZER-BIONT COVID-19 VAC-TRIS) SUSP injection Inject into the muscle.   Current Facility-Administered Medications (Other)  Medication Route   0.9 %  sodium chloride infusion Intravenous      REVIEW OF SYSTEMS:    ALLERGIES Allergies  Allergen Reactions   Fire Ant    Fish Allergy Hives    Some fish protiens he is allergic to like hering and [employer]	[place]- a schromboid issue per pt. - fresh fish is okay     PAST MEDICAL HISTORY Past Medical History:  Diagnosis Date   Angio-edema    Diverticulitis    ED (erectile dysfunction)    Kidney stones    2 episodes in 40 years    Urticaria    Past Surgical History:  Procedure Laterality Date   COLONOSCOPY     POLYPECTOMY     root canal     and redo    FAMILY HISTORY Family History  Problem Relation Age of Onset    Alzheimer's disease Mother    Heart disease Father 75       CABG   Diabetes Father    Colon cancer Neg Hx    Colon polyps Neg Hx    Rectal cancer Neg Hx    Stomach cancer Neg Hx    Angioedema Neg Hx    Asthma Neg Hx    Atopy Neg Hx    Eczema Neg Hx    Immunodeficiency Neg Hx    Urticaria Neg Hx    Allergic rhinitis Neg Hx     SOCIAL HISTORY Social History   Tobacco Use   Smoking status: Never   Smokeless tobacco: Never  Vaping Use   Vaping Use: Never used  Substance Use Topics   Alcohol use: Yes    Alcohol/week: 4.0 standard drinks    Types: 4 Glasses of wine per week   Drug use: No         OPHTHALMIC EXAM:  Base Eye Exam     Visual Acuity (ETDRS)       Right Left   Dist cc 20/20 +2 20/20 +1    Correction: Glasses            IMAGING AND PROCEDURES  Imaging and Procedures for 01/15/21  Repair Retinal Breaks, Laser - OS - Left Eye  Tear locations include nasal.   Time Out Confirmed correct patient, procedure, site, and patient consented.   Anesthesia Topical anesthesia was used. Anesthetic medications included Proparacaine 0.5%.   Laser Information The type of laser was diode. Color was yellow. The duration in seconds was 0.03. The spot size was 390 microns. Laser power was 400. Total spots was 112.   Post-op The patient tolerated the procedure well. There were no complications. The patient received written and verbal post procedure care education.   Notes OS, retinal holes at the 10 and 10:30 position treated today with laser retinopexy, each treated with 3 rows of laser retinopexy               ASSESSMENT/PLAN:  Retinal hole of right eye 5 days status post laser retinopexy no complications  Retinal hole, left OS, for laser retinopexy 1030 11:00 meridian today     ICD-10-CM   1. Retinal hole, left  H33.322 Repair Retinal Breaks, Laser - OS - Left Eye    2. Retinal hole of right eye  H33.321       1.  Laser  retinopexy planned around the retinal break at the 10 o'clock position left nasally, and another retinal hole found at the 11 o'clock position via infrared real-time video camera after laser, and laser retinopexy applied around each without complication  2.  Patient instructed to follow-up promptly new onset visual acuity decline distortions or curtain of darkness new onset flashes floaters  3.  Ophthalmic Meds Ordered this visit:  No orders of the defined types were placed in this encounter.      Return in about 4 months (around 05/18/2021) for DILATE OU, COLOR FP.  There are no Patient Instructions on file for this visit.   Explained the diagnoses, plan, and follow up with the patient and they expressed understanding.  Patient expressed understanding of the importance of proper follow up care.   Clent Demark Reena Borromeo M.D. Diseases & Surgery of the Retina and Vitreous Retina & Diabetic [place] 01/15/21     Abbreviations: M myopia (nearsighted); A astigmatism; H hyperopia (farsighted); P presbyopia; Mrx spectacle prescription;  CTL contact lenses; OD right eye; OS left eye; OU both eyes  XT exotropia; ET esotropia; PEK punctate epithelial keratitis; PEE punctate epithelial erosions; DES dry eye syndrome; MGD meibomian gland dysfunction; ATs artificial tears; PFAT's preservative free artificial tears; [place] nuclear sclerotic cataract; PSC posterior subcapsular cataract; ERM epi-retinal membrane; PVD posterior vitreous detachment; RD retinal detachment; DM diabetes mellitus; DR diabetic retinopathy; NPDR non-proliferative diabetic retinopathy; PDR proliferative diabetic retinopathy; CSME clinically significant macular edema; DME diabetic macular edema; dbh dot blot hemorrhages; CWS cotton wool spot; POAG primary open angle glaucoma; C/D cup-to-disc ratio; HVF humphrey visual field; GVF goldmann visual field; OCT optical coherence tomography; IOP intraocular pressure; BRVO Branch retinal vein  occlusion; CRVO central retinal vein occlusion; CRAO central retinal artery occlusion; BRAO branch retinal artery occlusion; RT retinal tear; SB scleral buckle; PPV pars plana vitrectomy; VH Vitreous hemorrhage; PRP panretinal laser photocoagulation; IVK intravitreal kenalog; VMT vitreomacular traction; MH Macular hole;  NVD neovascularization of the disc; NVE neovascularization elsewhere; AREDS age related eye disease study; ARMD age related macular degeneration; POAG primary open angle glaucoma; EBMD epithelial/anterior basement membrane dystrophy; ACIOL anterior chamber intraocular lens; IOL intraocular lens; PCIOL posterior chamber intraocular lens; Phaco/IOL phacoemulsification with intraocular lens placement; [place] photorefractive keratectomy; LASIK laser assisted in situ keratomileusis; HTN hypertension; DM diabetes mellitus; COPD chronic obstructive pulmonary disease

## 2021-01-15 NOTE — Assessment & Plan Note (Signed)
OS, for laser retinopexy 1030 11:00 meridian today

## 2021-02-04 ENCOUNTER — Encounter (INDEPENDENT_AMBULATORY_CARE_PROVIDER_SITE_OTHER): Payer: Medicare Other | Admitting: Ophthalmology

## 2021-02-04 NOTE — Progress Notes (Signed)
   Follow-Up Visit   Subjective  Dennis Diaz is a 75 y.o. male who presents for the following: Annual Exam (Here for full body skin examination- no new concerns).  Neuro skin examination, new bump on left cheek Location:  Duration:  Quality:  Associated Signs/Symptoms: Modifying Factors:  Severity:  Timing: Context:   Objective  Well appearing patient in no apparent distress; mood and affect are within normal limits. Scalp Full body skin check. No atypical moles.  1 possible nonmelanoma skin cancer on cheek will be biopsied.  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 All findings within normal limits unless otherwise noted below.   Assessment & Plan    Screening exam for skin cancer Scalp  Yearly skin check.  Seborrheic keratosis (2) Right Flank; Left Upper Back  Leave if stable.  Neoplasm of uncertain behavior of skin left cheek  Skin / nail biopsy Type of biopsy: tangential   Informed consent: discussed and consent obtained   Timeout: patient name, date of birth, surgical site, and procedure verified   Procedure prep:  Patient was prepped and draped in usual sterile fashion (Non sterile) Prep type:  Chlorhexidine Anesthesia: the lesion was anesthetized in a standard fashion   Anesthetic:  1% lidocaine w/ epinephrine 1-100,000 local infiltration Instrument used: flexible razor blade   Outcome: patient tolerated procedure well   Post-procedure details: wound care instructions given    Specimen 1 - Surgical pathology Differential  Diagnosis: bcc, scc, wart Cautery  Check Margins: No  Neurofibroma Right Abdomen (side) - Upper  Leave if stable      I, Lavonna Monarch, MD, have reviewed all documentation for this visit.  The documentation on 02/04/21 for the exam, diagnosis, procedures, and orders are all accurate and complete.

## 2021-04-15 ENCOUNTER — Ambulatory Visit: Payer: Medicare Other | Attending: Internal Medicine

## 2021-04-15 DIAGNOSIS — Z23 Encounter for immunization: Secondary | ICD-10-CM

## 2021-04-15 NOTE — Progress Notes (Signed)
   Covid-19 Vaccination Clinic  Name:  Dennis Diaz    MRN: [id number] DOB: Jan 27, 1946  04/15/2021  [name] was observed post Covid-19 immunization for 15 minutes without incident. He was provided with Vaccine Information Sheet and instruction to access the V-Safe system.   [name] was instructed to call 911 with any severe reactions post vaccine: Difficulty breathing  Swelling of face and throat  A fast heartbeat  A bad rash all over body  Dizziness and weakness

## 2021-05-13 ENCOUNTER — Other Ambulatory Visit (HOSPITAL_BASED_OUTPATIENT_CLINIC_OR_DEPARTMENT_OTHER): Payer: Self-pay

## 2021-05-13 MED ORDER — PFIZER COVID-19 VAC BIVALENT 30 MCG/0.3ML IM SUSP
INTRAMUSCULAR | 0 refills | Status: AC
  Filled 2021-05-13: qty 0.3, 1d supply, fill #0

## 2021-05-21 ENCOUNTER — Ambulatory Visit (INDEPENDENT_AMBULATORY_CARE_PROVIDER_SITE_OTHER): Payer: Medicare Other | Admitting: Ophthalmology

## 2021-05-21 ENCOUNTER — Other Ambulatory Visit: Payer: Self-pay

## 2021-05-21 ENCOUNTER — Encounter (INDEPENDENT_AMBULATORY_CARE_PROVIDER_SITE_OTHER): Payer: Self-pay | Admitting: Ophthalmology

## 2021-05-21 DIAGNOSIS — H33322 Round hole, left eye: Secondary | ICD-10-CM | POA: Diagnosis not present

## 2021-05-21 DIAGNOSIS — H2513 Age-related nuclear cataract, bilateral: Secondary | ICD-10-CM

## 2021-05-21 NOTE — Assessment & Plan Note (Signed)
Will recommend laser retinopexy to this new retinal break in the phakic eye,

## 2021-05-21 NOTE — Progress Notes (Signed)
05/21/2021     CHIEF COMPLAINT Patient presents for  Chief Complaint  Patient presents with   Retina Follow Up      HISTORY OF PRESENT ILLNESS: Dennis Diaz is a 75 y.o. male who presents to the clinic today for:   HPI     Retina Follow Up   Patient presents with  Other.  In both eyes.  This started 4 months ago.  Duration of 4 months.  Since onset it is gradually worsening.        Comments   4 month f/u OU with FP.  Pt c/o worsening vision in the right eye since previous visit.       Last edited by Reather Littler, COA on 05/21/2021 10:42 AM.      Referring physician: Sueanne Margarita, DO [address] [place],  [place] [address]  HISTORICAL INFORMATION:   Selected notes from the MEDICAL RECORD NUMBER    Lab Results  Component Value Date   HGBA1C 5.8 (H) 04/19/2015     CURRENT MEDICATIONS: No current outpatient medications on file. (Ophthalmic Drugs)   No current facility-administered medications for this visit. (Ophthalmic Drugs)   Current Outpatient Medications (Other)  Medication Sig   atorvastatin (LIPITOR) 40 MG tablet Take 40 mg by mouth daily.   Clobetasol Prop Emollient Base (CLOBETASOL PROPIONATE E) 0.05 % emollient cream Apply 1 application topically 2 (two) times daily.   COVID-19 mRNA bivalent vaccine, Pfizer, (PFIZER COVID-19 VAC BIVALENT) injection Inject into the muscle.   COVID-19 mRNA Vac-TriS, Pfizer, (PFIZER-BIONT COVID-19 VAC-TRIS) SUSP injection Inject into the muscle.   Current Facility-Administered Medications (Other)  Medication Route   0.9 %  sodium chloride infusion Intravenous      REVIEW OF SYSTEMS:    ALLERGIES Allergies  Allergen Reactions   Fire Ant    Fish Allergy Hives    Some fish protiens he is allergic to like hering and [employer]	[place]- a schromboid issue per pt. - fresh fish is okay     PAST MEDICAL HISTORY Past Medical History:  Diagnosis Date   Angio-edema    Diverticulitis    ED (erectile  dysfunction)    Kidney stones    2 episodes in 40 years    Urticaria    Past Surgical History:  Procedure Laterality Date   COLONOSCOPY     POLYPECTOMY     root canal     and redo    FAMILY HISTORY Family History  Problem Relation Age of Onset   Alzheimer's disease Mother    Heart disease Father 67       CABG   Diabetes Father    Colon cancer Neg Hx    Colon polyps Neg Hx    Rectal cancer Neg Hx    Stomach cancer Neg Hx    Angioedema Neg Hx    Asthma Neg Hx    Atopy Neg Hx    Eczema Neg Hx    Immunodeficiency Neg Hx    Urticaria Neg Hx    Allergic rhinitis Neg Hx     SOCIAL HISTORY Social History   Tobacco Use   Smoking status: Never   Smokeless tobacco: Never  Vaping Use   Vaping Use: Never used  Substance Use Topics   Alcohol use: Yes    Alcohol/week: 4.0 standard drinks    Types: 4 Glasses of wine per week   Drug use: No         OPHTHALMIC EXAM:  Base Eye  Exam     Visual Acuity (ETDRS)       Right Left   Dist cc 20/30 -2 20/20 -1   Dist ph cc 20/20 -1     Correction: Glasses         Tonometry (Tonopen, 10:47 AM)       Right Left   Pressure 12 9         Pupils       Pupils Dark Light Shape React APD   Right PERRL 4 3 Round Brisk None   Left PERRL 4 3 Round Brisk None         Visual Fields (Counting fingers)       Left Right    Full Full         Extraocular Movement       Right Left    Full, Ortho Full, Ortho         Neuro/Psych     Oriented x3: Yes   Mood/Affect: Normal         Dilation     Both eyes: 1.0% Mydriacyl, 2.5% Phenylephrine @ 10:47 AM           Slit Lamp and Fundus Exam     External Exam       Right Left   External Normal Normal         Slit Lamp Exam       Right Left   Lids/Lashes Normal Normal   Conjunctiva/Sclera White and quiet White and quiet   Cornea Clear Clear   Anterior Chamber Deep and quiet Deep and quiet   Iris Round and reactive Round and reactive   Lens 2+  Nuclear sclerosis, Trace Cortical cataract 2+ Nuclear sclerosis   Anterior Vitreous Normal Normal         Fundus Exam       Right Left   Posterior Vitreous Posterior vitreous detachment Posterior vitreous detachment   Disc Normal Normal   C/D Ratio 0.2 0.2   Macula Normal Normal   Vessels Normal Normal   Periphery Retinal hole 3:00, anterior to hyperpigmented region.  Good laser retinopexy 5:00 meridian from prior hole. No new retinal breaks. 730pigmented retinal hole, round, no prior treatmentPrior retinal breaks at 11 and 1130 with good retinopexy OS            IMAGING AND PROCEDURES  Imaging and Procedures for 05/21/21  Color Fundus Photography Optos - OU - Both Eyes       Right Eye Progression has been stable. Disc findings include normal observations. Macula : normal observations. Vessels : normal observations.   Left Eye Progression has been stable. Disc findings include normal observations. Macula : normal observations. Vessels : normal observations. Periphery : normal observations.   Notes Retinal hole, anterior to the hyperpigmented region at 230 meridian nasally OD.  OS retinal breaks seen clinically with pigmentation small hole 11:00 meridian not visualized on color fundus photography A new atrophic hole with early pigmentation changes at 7:30 position not visualized beneath the lids             ASSESSMENT/PLAN:  Cataract, nuclear sclerotic, both eyes Follow-up with Dr. Carolynn Sayers of [facility] eye care as scheduled  Retinal holes, left Will recommend laser retinopexy to this new retinal break in the phakic eye,     ICD-10-CM   1. Retinal hole, left  H33.322 Color Fundus Photography Optos - OU - Both Eyes    2. Cataract, nuclear sclerotic, both eyes  H25.13  3. Retinal holes, left  H33.322       OS, with new retinal hole inferiorly outside of prior regions of retinopexy.  We will need to offer laser retinopexy in order to prevent development of  retinal detachment.  Patient was dilated both eyes today we will deliver this next week or so dilate the left eye only  2.  3.  Ophthalmic Meds Ordered this visit:  No orders of the defined types were placed in this encounter.      Return in about 1 week (around 05/28/2021) for dilate, OS, RETINOPEXY, inferior 730 meridian.  There are no Patient Instructions on file for this visit.   Explained the diagnoses, plan, and follow up with the patient and they expressed understanding.  Patient expressed understanding of the importance of proper follow up care.   Clent Demark Jenna Routzahn M.D. Diseases & Surgery of the Retina and Vitreous Retina & Diabetic [place] 05/21/21     Abbreviations: M myopia (nearsighted); A astigmatism; H hyperopia (farsighted); P presbyopia; Mrx spectacle prescription;  CTL contact lenses; OD right eye; OS left eye; OU both eyes  XT exotropia; ET esotropia; PEK punctate epithelial keratitis; PEE punctate epithelial erosions; DES dry eye syndrome; MGD meibomian gland dysfunction; ATs artificial tears; PFAT's preservative free artificial tears; [place] nuclear sclerotic cataract; PSC posterior subcapsular cataract; ERM epi-retinal membrane; PVD posterior vitreous detachment; RD retinal detachment; DM diabetes mellitus; DR diabetic retinopathy; NPDR non-proliferative diabetic retinopathy; PDR proliferative diabetic retinopathy; CSME clinically significant macular edema; DME diabetic macular edema; dbh dot blot hemorrhages; CWS cotton wool spot; POAG primary open angle glaucoma; C/D cup-to-disc ratio; HVF humphrey visual field; GVF goldmann visual field; OCT optical coherence tomography; IOP intraocular pressure; BRVO Branch retinal vein occlusion; CRVO central retinal vein occlusion; CRAO central retinal artery occlusion; BRAO branch retinal artery occlusion; RT retinal tear; SB scleral buckle; PPV pars plana vitrectomy; VH Vitreous hemorrhage; PRP panretinal laser photocoagulation;  IVK intravitreal kenalog; VMT vitreomacular traction; MH Macular hole;  NVD neovascularization of the disc; NVE neovascularization elsewhere; AREDS age related eye disease study; ARMD age related macular degeneration; POAG primary open angle glaucoma; EBMD epithelial/anterior basement membrane dystrophy; ACIOL anterior chamber intraocular lens; IOL intraocular lens; PCIOL posterior chamber intraocular lens; Phaco/IOL phacoemulsification with intraocular lens placement; [place] photorefractive keratectomy; LASIK laser assisted in situ keratomileusis; HTN hypertension; DM diabetes mellitus; COPD chronic obstructive pulmonary disease

## 2021-05-21 NOTE — Assessment & Plan Note (Signed)
Follow-up with Dr. Carolynn Sayers of [facility] eye care as scheduled

## 2021-05-29 ENCOUNTER — Ambulatory Visit (INDEPENDENT_AMBULATORY_CARE_PROVIDER_SITE_OTHER): Payer: Medicare Other | Admitting: Ophthalmology

## 2021-05-29 ENCOUNTER — Other Ambulatory Visit: Payer: Self-pay

## 2021-05-29 ENCOUNTER — Encounter (INDEPENDENT_AMBULATORY_CARE_PROVIDER_SITE_OTHER): Payer: Self-pay | Admitting: Ophthalmology

## 2021-05-29 DIAGNOSIS — H33322 Round hole, left eye: Secondary | ICD-10-CM

## 2021-05-29 NOTE — Progress Notes (Signed)
05/29/2021     CHIEF COMPLAINT Patient presents for  Chief Complaint  Patient presents with   Retina Evaluation      HISTORY OF PRESENT ILLNESS: Dennis Diaz is a 75 y.o. male who presents to the clinic today for:   HPI     Retina Evaluation   In left eye.  I, the attending physician,  performed the HPI with the patient and updated documentation appropriately.        Comments   Follow-up today for planned procedure laser retinopexy left eye for atrophic retinal hole found upon last follow-up visit.      Last edited by Hurman Horn, MD on 05/29/2021  8:21 AM.      Referring physician: Clent Jacks, MD [address] [place],  [place] [address]  HISTORICAL INFORMATION:   Selected notes from the MEDICAL RECORD NUMBER    Lab Results  Component Value Date   HGBA1C 5.8 (H) 04/19/2015     CURRENT MEDICATIONS: No current outpatient medications on file. (Ophthalmic Drugs)   No current facility-administered medications for this visit. (Ophthalmic Drugs)   Current Outpatient Medications (Other)  Medication Sig   atorvastatin (LIPITOR) 40 MG tablet Take 40 mg by mouth daily.   Clobetasol Prop Emollient Base (CLOBETASOL PROPIONATE E) 0.05 % emollient cream Apply 1 application topically 2 (two) times daily.   COVID-19 mRNA bivalent vaccine, Pfizer, (PFIZER COVID-19 VAC BIVALENT) injection Inject into the muscle.   COVID-19 mRNA Vac-TriS, Pfizer, (PFIZER-BIONT COVID-19 VAC-TRIS) SUSP injection Inject into the muscle.   Current Facility-Administered Medications (Other)  Medication Route   0.9 %  sodium chloride infusion Intravenous      REVIEW OF SYSTEMS:    ALLERGIES Allergies  Allergen Reactions   Fire Ant    Fish Allergy Hives    Some fish protiens he is allergic to like hering and [employer]	[place]- a schromboid issue per pt. - fresh fish is okay     PAST MEDICAL HISTORY Past Medical History:  Diagnosis Date   Angio-edema    Diverticulitis    ED  (erectile dysfunction)    Kidney stones    2 episodes in 40 years    Urticaria    Past Surgical History:  Procedure Laterality Date   COLONOSCOPY     POLYPECTOMY     root canal     and redo    FAMILY HISTORY Family History  Problem Relation Age of Onset   Alzheimer's disease Mother    Heart disease Father 34       CABG   Diabetes Father    Colon cancer Neg Hx    Colon polyps Neg Hx    Rectal cancer Neg Hx    Stomach cancer Neg Hx    Angioedema Neg Hx    Asthma Neg Hx    Atopy Neg Hx    Eczema Neg Hx    Immunodeficiency Neg Hx    Urticaria Neg Hx    Allergic rhinitis Neg Hx     SOCIAL HISTORY Social History   Tobacco Use   Smoking status: Never   Smokeless tobacco: Never  Vaping Use   Vaping Use: Never used  Substance Use Topics   Alcohol use: Yes    Alcohol/week: 4.0 standard drinks    Types: 4 Glasses of wine per week   Drug use: No         OPHTHALMIC EXAM:  Base Eye Exam     Visual Acuity (  ETDRS)       Right Left   Dist cc 20/25 -2 20/15         Tonometry (Tonopen, 8:24 AM)       Right Left   Pressure 12 10         Neuro/Psych     Oriented x3: Yes   Mood/Affect: Normal         Dilation     Left eye: 1.0% Mydriacyl, 2.5% Phenylephrine @ 8:24 AM           Slit Lamp and Fundus Exam     External Exam       Right Left   External Normal Normal         Slit Lamp Exam       Right Left   Lids/Lashes Normal Normal   Conjunctiva/Sclera White and quiet White and quiet   Cornea Clear Clear   Anterior Chamber Deep and quiet Deep and quiet   Iris Round and reactive Round and reactive   Lens 2+ Nuclear sclerosis, Trace Cortical cataract 2+ Nuclear sclerosis   Anterior Vitreous Normal Normal         Fundus Exam       Right Left   Posterior Vitreous  Posterior vitreous detachment   Disc  Normal   C/D Ratio 0.2 0.2   Macula  Normal   Vessels  Normal   Periphery  730pigmented retinal hole, round, no prior  treatmentPrior retinal breaks at 11 and 1130 with good retinopexy OS            IMAGING AND PROCEDURES  Imaging and Procedures for 05/29/21  Repair Retinal Breaks, Laser - OS - Left Eye       Tear locations include inferior, nasal.   Time Out Confirmed correct patient, procedure, site, and patient consented.   Anesthesia Topical anesthesia was used. Anesthetic medications included Proparacaine 0.5%.   Laser Information The type of laser was diode. Color was yellow.   Post-op The patient tolerated the procedure well. There were no complications. The patient received written and verbal post procedure care education.   Notes 3 rows of laser retinopexy applied.  Retinopexy applied today treatment: 730, small thin atrophic region at 6 and a new hole that was found today also at 430 inferotemporal.             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 Retinal hole, left  H33.322 Repair Retinal Breaks, Laser - OS - Left Eye      1.  OS laser retinopexy completed without difficulty or complication.  Good laser reaction confirmed.  All photos documented on NAVILAS laser.  2.  3.  Ophthalmic Meds Ordered this visit:  No orders of the defined types were placed in this encounter.      Return in about 4 months (around 09/26/2021) for DILATE OU, COLOR FP, OCT.  There are no Patient Instructions on file for this visit.   Explained the diagnoses, plan, and follow up with the patient and they expressed understanding.  Patient expressed understanding of the importance of proper follow up care.  Clent Demark Sandia Pfund M.D. Diseases & Surgery of the Retina and Vitreous [facility]ina [facility] Diabetic [place] 05/29/21     Abbreviations: M myopia (nearsighted); A astigmatism; H hyperopia (farsighted); P presbyopia; Mrx spectacle prescription;  CTL contact lenses; OD right eye; OS left eye; OU both eyes  XT exotropia; ET esotropia; PEK punctate epithelial keratitis; PEE punctate epithelial erosions; DES dry eye syndrome; MGD meibomian gland dysfunction; ATs artificial tears; PFAT's preservative free artificial tears; [place] nuclear sclerotic cataract; PSC posterior subcapsular cataract; ERM epi-retinal membrane; PVD posterior vitreous detachment; RD retinal detachment; DM diabetes mellitus; DR diabetic retinopathy; NPDR non-proliferative diabetic retinopathy; PDR proliferative diabetic retinopathy; CSME clinically significant macular edema; DME diabetic macular edema; dbh dot blot hemorrhages; CWS cotton wool spot; POAG primary open angle glaucoma; C/D cup-to-disc ratio; HVF humphrey visual field; GVF goldmann visual field; OCT optical coherence tomography; IOP intraocular pressure; BRVO Branch retinal vein occlusion; CRVO central retinal vein occlusion; CRAO central retinal artery occlusion; BRAO branch retinal artery occlusion; RT retinal tear; SB scleral buckle; PPV pars plana vitrectomy; VH Vitreous hemorrhage; PRP panretinal laser photocoagulation; IVK intravitreal kenalog; VMT vitreomacular traction; MH Macular hole;  NVD neovascularization of the disc; NVE neovascularization elsewhere; AREDS age related eye disease study; ARMD age related macular degeneration; POAG primary open angle glaucoma; EBMD epithelial/anterior basement membrane dystrophy; ACIOL anterior chamber intraocular lens; IOL intraocular lens; PCIOL posterior chamber intraocular lens; Phaco/IOL phacoemulsification with intraocular lens placement; [place] photorefractive keratectomy; LASIK laser assisted in situ keratomileusis; HTN hypertension; DM diabetes  mellitus; COPD chronic obstructive pulmonary disease

## 2021-05-29 NOTE — Assessment & Plan Note (Addendum)
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

## 2021-07-25 IMAGING — CT CT CARDIAC CORONARY ARTERY CALCIUM SCORE
3 series · 14 of 20 positions shown, 16 images · non-contrast
Comparison: None.

CLINICAL DATA: 75-year-old Caucasian male with risk factors for
coronary artery disease including hyperlipidemia, family history of
coronary artery disease and former smoker.

EXAM:
CT CARDIAC CORONARY ARTERY CALCIUM SCORE
TECHNIQUE: Non-contrast imaging through the heart was performed using
prospective ECG gating. Image post processing was performed on an
independent workstation, allowing for quantitative analysis of the
heart and coronary arteries. Note that this exam targets the heart
and the chest was not imaged in its entirety.

[Series 2: calcium scoring 2.00 qr36 bestdiast 69% hrt calciu · axial · 0.41mm/px · z∈[+1528,+1606]mm · 4 of 66 slices shown]
[im 14/66  vessel]
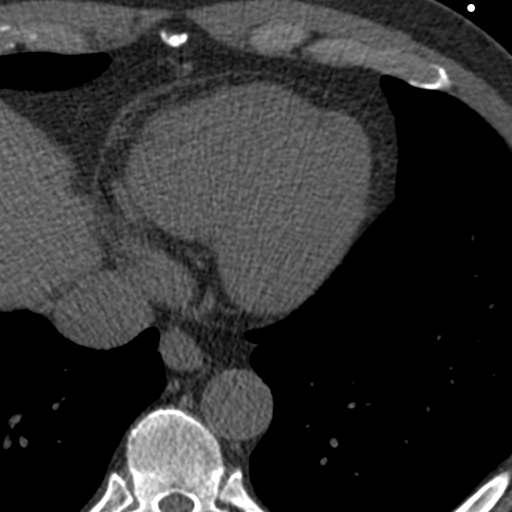
[im 27/66  vessel]
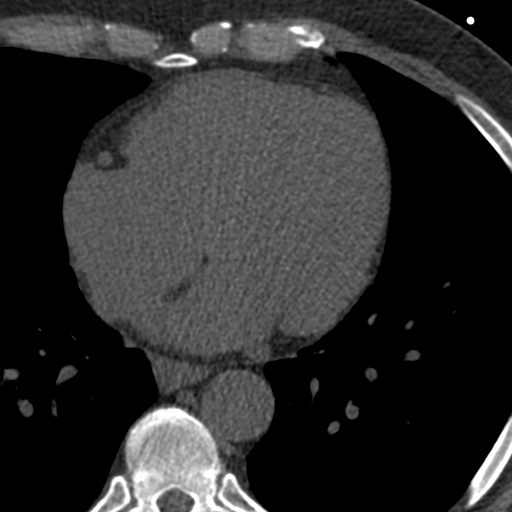
[im 40/66  vessel]
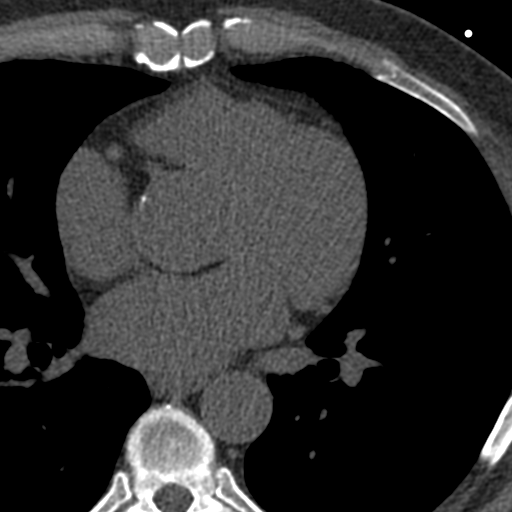
[im 53/66  vessel]
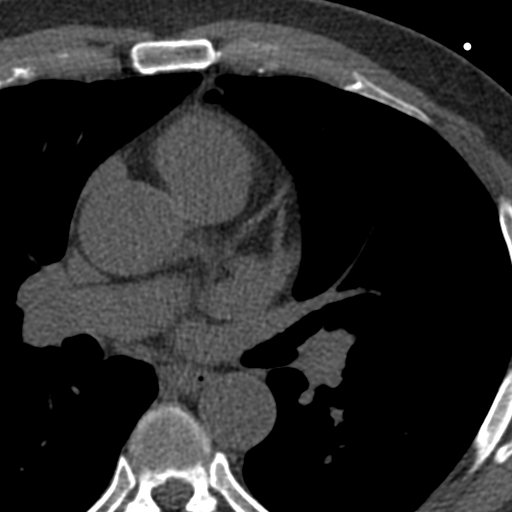

[Series 3: calcium scoring 2.00 br40 bestdiast 69% axial · axial · 0.60mm/px · z∈[+1522,+1610]mm · 5 of 66 slices shown, 7 images]
[im 11/66  vessel]
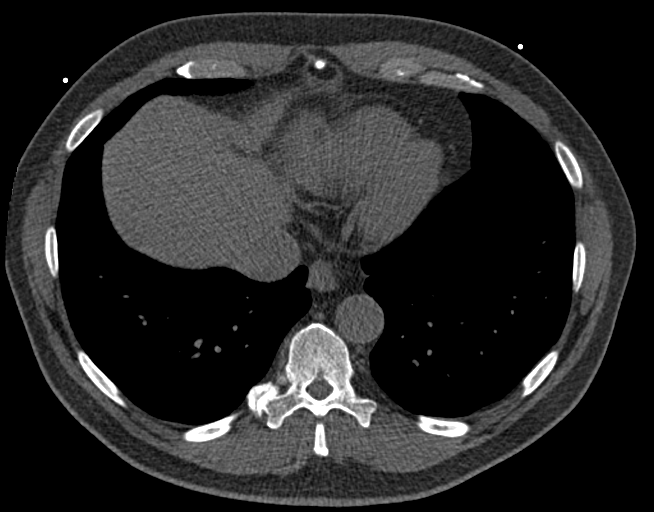
[im 11/66  lung]
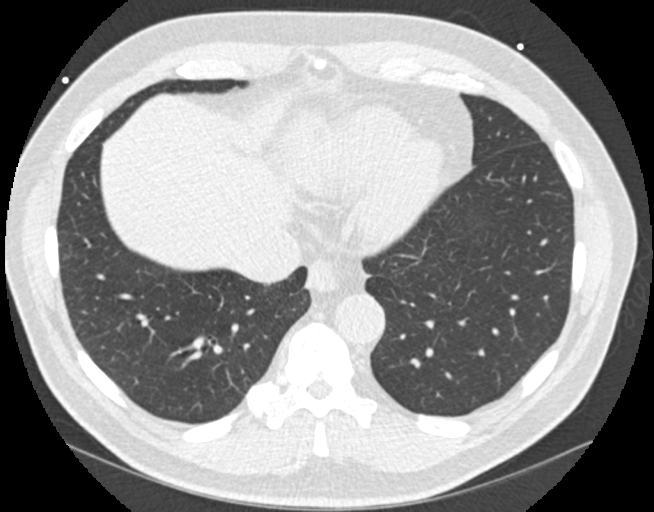
[im 22/66  vessel]
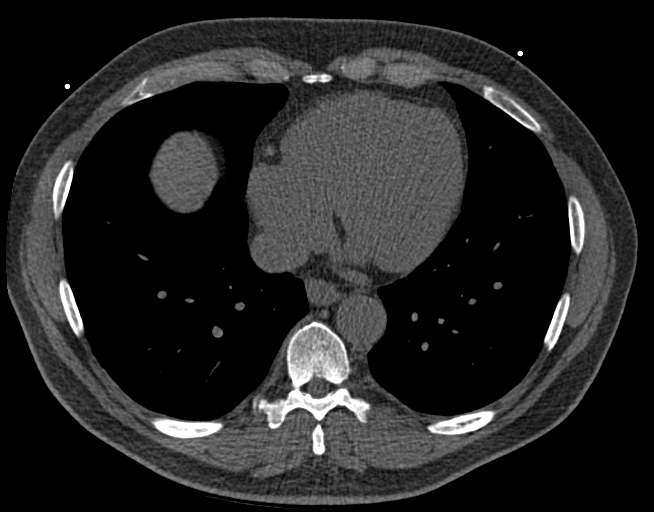
[im 33/66  vessel]
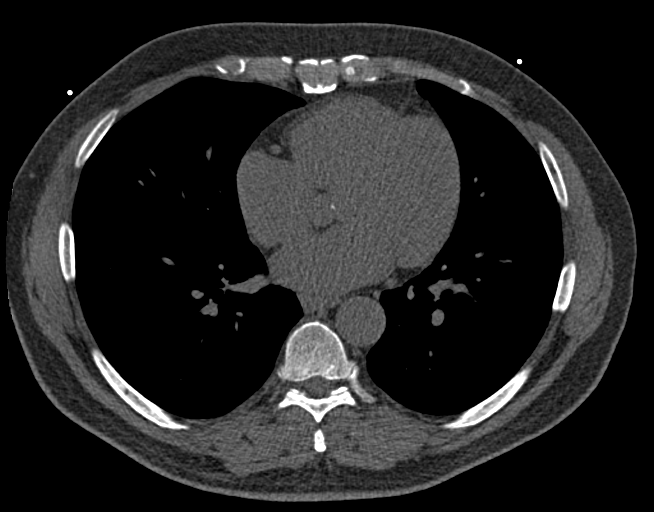
[im 44/66  vessel]
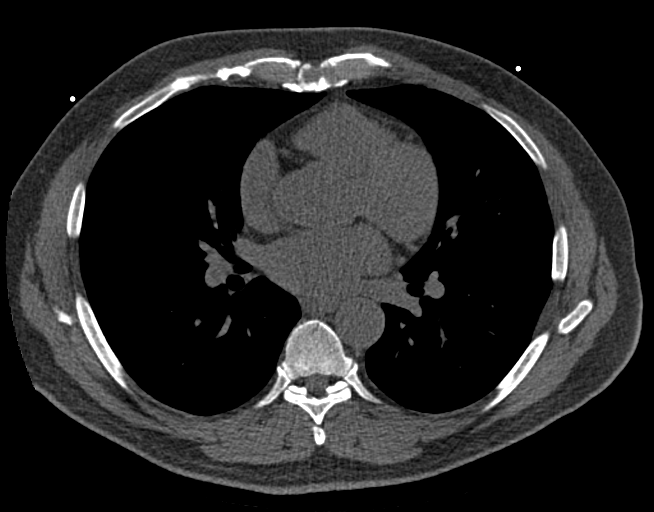
[im 55/66  vessel]
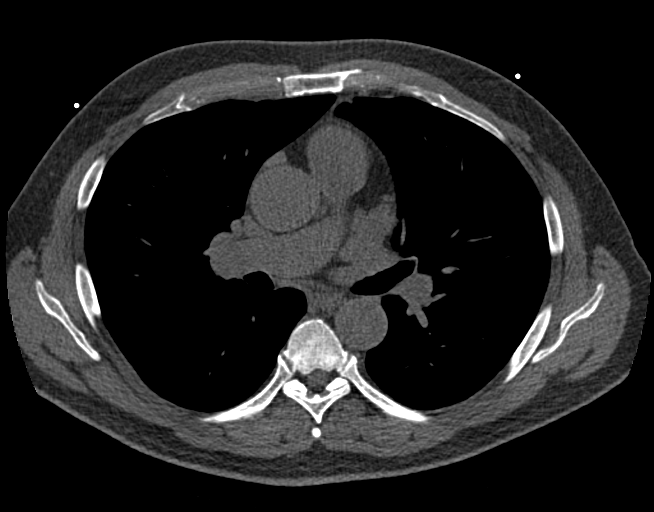
[im 55/66  lung]
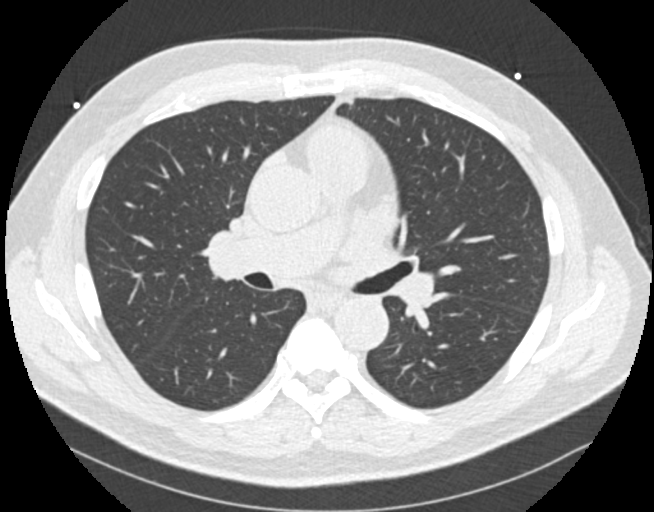

[Series 9: calcium scoring 2.00 br60 bestdiast 69% lungs · axial · 0.60mm/px · z∈[+1522,+1610]mm · 5 of 66 slices shown]
[im 11/66  vessel]
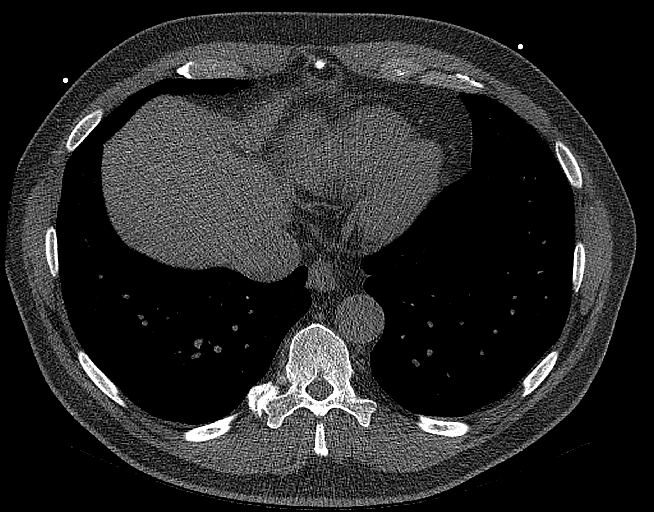
[im 22/66  vessel]
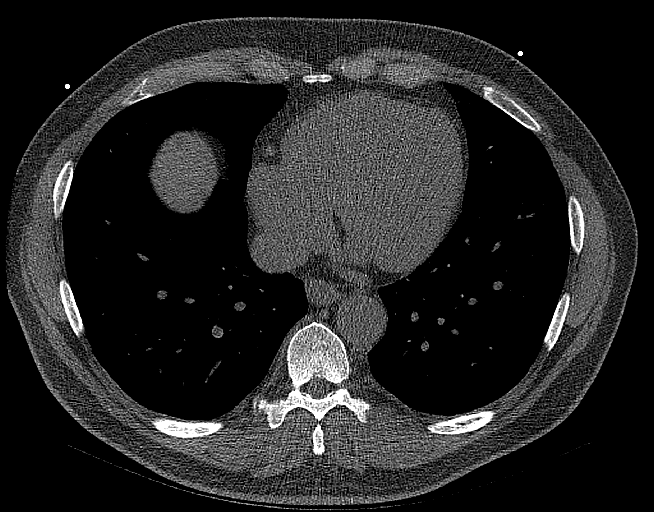
[im 33/66  vessel]
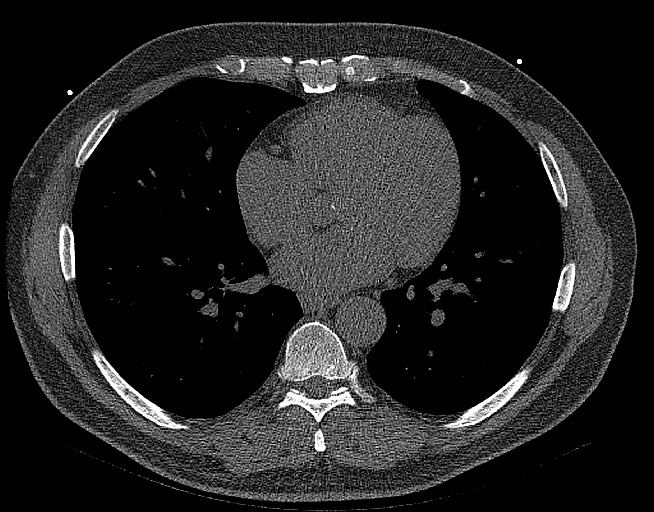
[im 44/66  vessel]
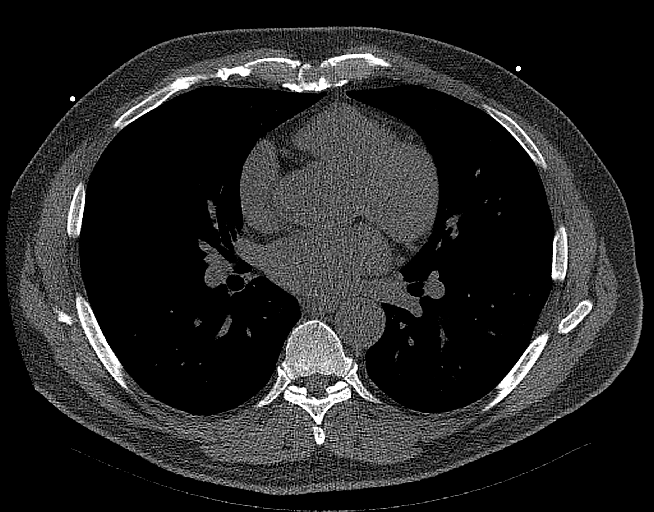
[im 55/66  vessel]
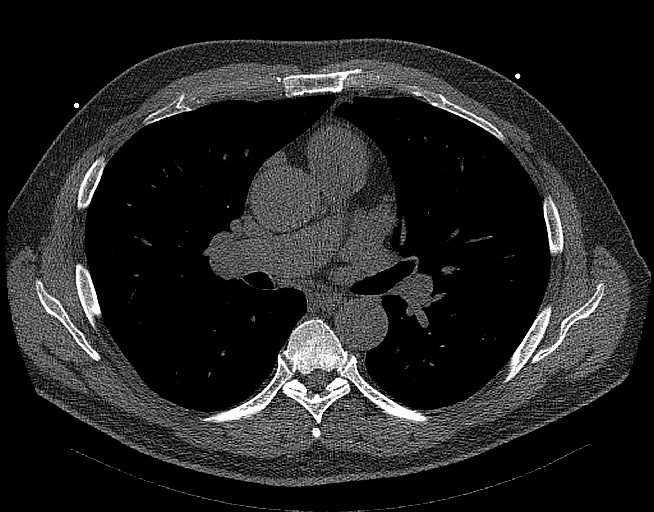

[14 of 20 positions shown; findings below may reference images not displayed]

FINDINGS: CORONARY CALCIUM SCORES:

Left Main: 0

LAD:

LCx: 0

RCA: 0

Total Agatston Score:

[HOSPITAL] percentile: 24th

AORTA MEASUREMENTS:

Ascending Aorta: 42 mm

Descending Aorta: 34 mm

OTHER FINDINGS:

Visualized lung parenchyma clear with mild emphysematous changes.

No pathologic lymphadenopathy within the visualized mediastinum.
Visualized esophagus normal in appearance.

Small hiatal hernia. Visualized upper abdomen otherwise unremarkable
for the unenhanced technique.

Visualized bony thorax intact.
IMPRESSION: 1. Total coronary artery calcium Agatston score of 42.4 which places
the patient in the 24th percentile for age, gender and race
according to the [HOSPITAL].
2. Mild aneurysmal dilatation of the ascending thoracic aorta
measuring up to 42 mm. Recommend annual imaging followup by CTA or
MRA. This recommendation follows 2141
ACCF/AHA/AATS/ACR/ASA/SCA/VIMEUX/BOURS/LUMANES/UTADA Guidelines for the
Diagnosis and Management of Patients with Thoracic Aortic Disease.
Circulation. 2141; 121: E266-e369.
3. Small hiatal hernia.

Emphysema ([id number])

Aortic aneurysm NOS ([id number]-GPX.D)

## 2021-08-28 ENCOUNTER — Encounter: Payer: Self-pay | Admitting: Gastroenterology

## 2021-09-26 ENCOUNTER — Encounter (INDEPENDENT_AMBULATORY_CARE_PROVIDER_SITE_OTHER): Payer: Medicare Other | Admitting: Ophthalmology

## 2021-09-30 ENCOUNTER — Ambulatory Visit (INDEPENDENT_AMBULATORY_CARE_PROVIDER_SITE_OTHER): Payer: Medicare Other | Admitting: Ophthalmology

## 2021-09-30 ENCOUNTER — Encounter (INDEPENDENT_AMBULATORY_CARE_PROVIDER_SITE_OTHER): Payer: Self-pay | Admitting: Ophthalmology

## 2021-09-30 DIAGNOSIS — H33322 Round hole, left eye: Secondary | ICD-10-CM

## 2021-09-30 DIAGNOSIS — H33321 Round hole, right eye: Secondary | ICD-10-CM

## 2021-09-30 DIAGNOSIS — H2513 Age-related nuclear cataract, bilateral: Secondary | ICD-10-CM | POA: Diagnosis not present

## 2021-09-30 NOTE — Assessment & Plan Note (Signed)
Stable no new breaks ?

## 2021-09-30 NOTE — Assessment & Plan Note (Signed)
Moderate progression, ? ?Sunglasses analogy reviewed.  Follow-up Dr. Katy Fitch as scheduled ?

## 2021-09-30 NOTE — Progress Notes (Signed)
? ? ?09/30/2021 ? ?  ? ?CHIEF COMPLAINT ?Patient presents for  ?Chief Complaint  ?Patient presents with  ? Retina Follow Up  ? ? ? ? ?HISTORY OF PRESENT ILLNESS: ?Dennis Diaz is a 76 y.o. male who presents to the clinic today for:  ? ?HPI   ? ? Retina Follow Up   ? ?      ? Diagnosis: Other (Retinal hole)  ? Laterality: left eye  ? Onset: 4 months ago  ? ?  ?  ? ? Comments   ?4 mos fu OU oct fp. ?Patient states vision is stable and unchanged since last visit. Denies any new floaters or FOL. ? ? ?  ?  ?Last edited by Laurin Coder on 09/30/2021  8:28 AM.  ?  ? ? ?Referring physician: ?Debbra Riding, MD ?[address] ?[address] ?[place],  [place] [address] ? ?HISTORICAL INFORMATION:  ? ?Selected notes from the [facility] ?  ? ?Lab Results  ?Component Value Date  ? HGBA1C 5.8 (H) 04/19/2015  ?  ? ?CURRENT MEDICATIONS: ?No current outpatient medications on file. (Ophthalmic Drugs)  ? ?No current facility-administered medications for this visit. (Ophthalmic Drugs)  ? ?Current Outpatient Medications (Other)  ?Medication Sig  ? atorvastatin (LIPITOR) 40 MG tablet Take 40 mg by mouth daily.  ? Clobetasol Prop Emollient Base (CLOBETASOL PROPIONATE E) 0.05 % emollient cream Apply 1 application topically 2 (two) times daily.  ? COVID-19 mRNA bivalent vaccine, Pfizer, (PFIZER COVID-19 VAC BIVALENT) injection Inject into the muscle.  ? COVID-19 mRNA Vac-TriS, Pfizer, (PFIZER-BIONT COVID-19 VAC-TRIS) SUSP injection Inject into the muscle.  ? ?Current Facility-Administered Medications (Other)  ?Medication Route  ? 0.9 %  sodium chloride infusion Intravenous  ? ? ? ? ?REVIEW OF SYSTEMS: ?ROS   ?Negative for: Constitutional, Gastrointestinal, Neurological, Skin, Genitourinary, Musculoskeletal, HENT, Endocrine, Cardiovascular, Eyes, Respiratory, Psychiatric, Allergic/Imm, Heme/Lymph ?Last edited by Hurman Horn, MD on 09/30/2021  9:11 AM.  ?  ? ? ? ?ALLERGIES ?Allergies  ?Allergen Reactions  ? Fire [employer]   ? Fish Allergy Hives   ?  Some fish protiens he is allergic to like hering and [place]- a schromboid issue per pt. - fresh fish is okay   ? ? ?PAST MEDICAL HISTORY ?Past Medical History:  ?Diagnosis Date  ? Angio-edema   ? Diverticulitis   ? ED (erectile dysfunction)   ? Kidney stones   ? 2 episodes in 40 years   ? Urticaria   ? ?Past Surgical History:  ?Procedure Laterality Date  ? COLONOSCOPY    ? POLYPECTOMY    ? root canal    ? and redo  ? ? ?FAMILY HISTORY ?Family History  ?Problem Relation Age of Onset  ? Alzheimer's disease Mother   ? Heart disease Father 26  ?     CABG  ? Diabetes Father   ? Colon cancer Neg Hx   ? Colon polyps Neg Hx   ? Rectal cancer Neg Hx   ? Stomach cancer Neg Hx   ? Angioedema Neg Hx   ? Asthma Neg Hx   ? Atopy Neg Hx   ? Eczema Neg Hx   ? Immunodeficiency Neg Hx   ? Urticaria Neg Hx   ? Allergic rhinitis Neg Hx   ? ? ?SOCIAL HISTORY ?Social History  ? ?Tobacco Use  ? Smoking status: Never  ? Smokeless tobacco: Never  ?Vaping Use  ? Vaping Use: Never used  ?Substance Use Topics  ?  Alcohol use: Yes  ?  Alcohol/week: 4.0 standard drinks  ?  Types: 4 Glasses of wine per week  ? Drug use: No  ? ?  ? ?  ? ?OPHTHALMIC EXAM: ? ?Base Eye Exam   ? ? Visual Acuity (ETDRS)   ? ?   Right Left  ? Dist cc 20/25 -1 20/20 -1  ? ? Correction: Glasses  ? ?  ?  ? ? Tonometry (Tonopen, 8:29 AM)   ? ?   Right Left  ? Pressure 10 7  ? ?  ?  ? ? Pupils   ? ?   Pupils Dark Light APD  ? Right PERRL 4 3 None  ? Left PERRL 4 3 None  ? ?  ?  ? ? Extraocular Movement   ? ?   Right Left  ?  Full Full  ? ?  ?  ? ? Neuro/Psych   ? ? Oriented x3: Yes  ? Mood/Affect: Normal  ? ?  ?  ? ? Dilation   ? ? Both eyes: 1.0% Mydriacyl, 2.5% Phenylephrine @ 8:29 AM  ? ?  ?  ? ?  ? ?Slit Lamp and Fundus Exam   ? ? External Exam   ? ?   Right Left  ? External Normal Normal  ? ?  ?  ? ? Slit Lamp Exam   ? ?   Right Left  ? Lids/Lashes Normal Normal  ? Conjunctiva/Sclera White and quiet White and quiet  ? Cornea Clear Clear  ? Anterior Chamber Deep  and quiet Deep and quiet  ? Iris Round and reactive Round and reactive  ? Lens 2+ Nuclear sclerosis, Trace Cortical cataract 2+ Nuclear sclerosis  ? Anterior Vitreous Normal Normal  ? ?  ?  ? ? Fundus Exam   ? ?   Right Left  ? Posterior Vitreous Posterior vitreous detachment Posterior vitreous detachment  ? Disc Normal Normal  ? C/D Ratio 0.2 0.2  ? Macula Normal Normal  ? Vessels Normal Normal  ? Periphery Retinal hole 3:00, anterior to hyperpigmented region.  Good laser retinopexy 5:00 meridian from prior hole. No new retinal breaks. 730pigmented retinal hole, round, no prior treatmentPrior retinal breaks at 11 and 1130 with good retinopexy OS  ? ?  ?  ? ?  ? ? ?IMAGING AND PROCEDURES  ?Imaging and Procedures for 09/30/21 ? ?OCT, Retina - OU - Both Eyes   ? ?   ?Right Eye ?Quality was good. Scan locations included subfoveal. Central Foveal Thickness: 330. Progression has no prior data. Findings include normal foveal contour.  ? ?Left Eye ?Quality was good. Scan locations included subfoveal. Central Foveal Thickness: 331. Progression has no prior data. Findings include normal foveal contour.  ? ?  ? ?Color Fundus Photography Optos - OU - Both Eyes   ? ?   ?Right Eye ?Progression has been stable. Disc findings include normal observations. Macula : normal observations. Vessels : normal observations.  ? ?Left Eye ?Progression has been stable. Disc findings include normal observations. Macula : normal observations. Vessels : normal observations. Periphery : normal observations.  ? ?Notes ?Retinal hole, anterior to the hyperpigmented region at 230 meridian nasally OD. ? ?OS retinal breaks seen clinically with pigmentation small hole 11:00 meridian not visualized on color fundus photography ?A old atrophic hole with early pigmentation changes at 7:30 position not visualized beneath the lids ? ?  ? ? ?  ?  ? ?  ?ASSESSMENT/PLAN: ? ?Retinal holes,  left ?Stable no new breaks ? ?Retinal hole of right eye ?Stable no new  breaks ? ?Cataract, nuclear sclerotic, both eyes ?Moderate progression, ? ?Sunglasses analogy reviewed.  Follow-up Dr. Katy Fitch as scheduled  ? ?  ICD-10-CM   ?1. Retinal hole, left  H33.322 OCT, Retina - OU - Both Eyes  ?  Color Fundus Photography Optos - OU - Both Eyes  ?  ?2. Retinal holes, left  H33.322   ?  ?3. Retinal hole of right eye  H33.321   ?  ?4. Cataract, nuclear sclerotic, both eyes  H25.13   ?  ? ? ?1.  OU with retinal holes, no signs of new breaks. ? ?2.  Stable overall, moderate progression of the lens changes OU, discussed typical symptoms that might suggest he might need to evaluate cataract surgery ? ?3. ? ?Ophthalmic Meds Ordered this visit:  ?No orders of the defined types were placed in this encounter. ? ? ?  ? ?Return in about 1 year (around 10/01/2022) for DILATE OU, COLOR FP, OCT. ? ?There are no Patient Instructions on file for this visit. ? ? ?Explained the diagnoses, plan, and follow up with the patient and they expressed understanding.  Patient expressed understanding of the importance of proper follow up care.  ? ?Clent Demark. Lamere Lightner M.D. ?Diseases & Surgery of the Retina and Vitreous ?[place] ?09/30/21 ? ? ? ? ?Abbreviations: ?M myopia (nearsighted); A astigmatism; H hyperopia (farsighted); P presbyopia; Mrx spectacle prescription;  CTL contact lenses; OD right eye; OS left eye; OU both eyes  XT exotropia; ET esotropia; PEK punctate epithelial keratitis; PEE punctate epithelial erosions; DES dry eye syndrome; MGD meibomian gland dysfunction; ATs artificial tears; PFAT's preservative free artificial tears; [place] nuclear sclerotic cataract; PSC posterior subcapsular cataract; ERM epi-retinal membrane; PVD posterior vitreous detachment; RD retinal detachment; DM diabetes mellitus; DR diabetic retinopathy; NPDR non-proliferative diabetic retinopathy; PDR proliferative diabetic retinopathy; CSME clinically significant macular edema; DME diabetic macular edema; dbh dot blot  hemorrhages; CWS cotton wool spot; POAG primary open angle glaucoma; C/D cup-to-disc ratio; HVF humphrey visual field; GVF goldmann visual field; OCT optical coherence tomography; IOP intraocular pressure; BRVO

## 2021-10-14 ENCOUNTER — Ambulatory Visit: Payer: Medicare Other | Admitting: Dermatology

## 2021-10-14 ENCOUNTER — Encounter: Payer: Self-pay | Admitting: Dermatology

## 2021-10-14 DIAGNOSIS — L111 Transient acantholytic dermatosis [Grover]: Secondary | ICD-10-CM | POA: Diagnosis not present

## 2021-10-14 DIAGNOSIS — Z86018 Personal history of other benign neoplasm: Secondary | ICD-10-CM

## 2021-10-14 DIAGNOSIS — L570 Actinic keratosis: Secondary | ICD-10-CM | POA: Diagnosis not present

## 2021-10-14 DIAGNOSIS — L719 Rosacea, unspecified: Secondary | ICD-10-CM | POA: Diagnosis not present

## 2021-10-14 DIAGNOSIS — L738 Other specified follicular disorders: Secondary | ICD-10-CM

## 2021-10-14 DIAGNOSIS — Z1283 Encounter for screening for malignant neoplasm of skin: Secondary | ICD-10-CM | POA: Diagnosis not present

## 2021-10-14 DIAGNOSIS — R238 Other skin changes: Secondary | ICD-10-CM

## 2021-10-14 DIAGNOSIS — B351 Tinea unguium: Secondary | ICD-10-CM

## 2021-10-14 MED ORDER — CLINDAMYCIN PHOSPHATE 1 % EX GEL: CUTANEOUS | 2 refills | Status: AC

## 2021-10-14 MED ORDER — CLOBETASOL PROPIONATE 0.05 % EX CREA: 1.0000 "application " | TOPICAL_CREAM | Freq: Two times a day (BID) | CUTANEOUS | 5 refills | Status: AC

## 2021-10-14 NOTE — Patient Instructions (Addendum)
Right foot Over the counter Lotrimin cream for right foot, if this don't work add the clindamycin nightly  ? ?Buttock over the counter hydrocortisone cream  ?

## 2021-10-29 ENCOUNTER — Ambulatory Visit (AMBULATORY_SURGERY_CENTER): Payer: Medicare Other | Admitting: *Deleted

## 2021-10-29 ENCOUNTER — Encounter: Payer: Self-pay | Admitting: Dermatology

## 2021-10-29 VITALS — Ht 74.0 in | Wt 212.0 lb

## 2021-10-29 DIAGNOSIS — Z86010 Personal history of colonic polyps: Secondary | ICD-10-CM

## 2021-10-29 MED ORDER — NA SULFATE-K SULFATE-MG SULF 17.5-3.13-1.6 GM/177ML PO SOLN: 1.0000 | Freq: Once | ORAL | 0 refills | Status: AC

## 2021-10-29 NOTE — Progress Notes (Signed)

## 2021-10-29 NOTE — Progress Notes (Signed)
? ?  Follow-Up Visit ?  ?Subjective  ?Dennis Diaz is a 76 y.o. male who presents for the following: Annual Exam (Yearly skin exam no personal history of atypia or skin cancer. Refill clobetasol for grovers on chest and back for PRN use). ? ?General skin check, multiple skin issues to discuss ?Location:  ?Duration:  ?Quality:  ?Associated Signs/Symptoms: ?Modifying Factors:  ?Severity:  ?Timing: ?Context:  ? ?Objective  ?Well appearing patient in no apparent distress; mood and affect are within normal limits. 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  I reviewed with patient what little is known about the etiology of this disorder along with the generally favorable long-term prognosis. ? ?Mid Forehead ?Multiple 2 mm flesh-colored papules with eccentric dell, compatible dermoscopy ? ?Left Hand - Posterior, Right Hand - Posterior ?Gritty to hornlike 5 mm crusts ? ?Right Malar Cheek ?Central facial erythema, few inflammatory papules ? ?Right Foot - Anterior ?Moist white scale total hips, thickening great toenails.  Suspect toe webs are mixed bacterial plus fungus and will use daily topical clindamycin plus over-the-counter clotrimazole for minimal 1 month.  No specific intervention for toenails. ? ?Gluteal Crease ?Mild irritant dermatitis without ulceration or pustules. ? ? ? ?A full examination was performed including scalp, head, eyes, ears, nose, lips, neck, chest, axillae, abdomen, back, buttocks, bilateral upper extremities, bilateral lower extremities, hands, feet, fingers, toes, fingernails, and toenails. All findings within normal limits unless otherwise noted below. ? ? ?Assessment & Plan  ? ? ?Grover's disease ? ?He may use the clobetasol on a as needed basis to reduce itching.  Avoid use on face and body folds.  To contact me if symptoms worsen despite this. ? ?clobetasol cream (TEMOVATE) 0.05 % ?Apply 1 application. topically 2  (two) times daily. Apply to grovers 1-2 times daily for flare not for face or skin folds ? ?Sebaceous hyperplasia of face ?Mid Forehead ? ?Told of similar appearance of early [facility] so if any lesion grows or bleeds return for biopsy ? ?AK (actinic keratosis) (2) ?Left Hand - Posterior; Right Hand - Posterior ? ?If bracing fails return for possible biopsy ? ?Destruction of lesion - Left Hand - Posterior, Right Hand - Posterior ?Complexity: simple   ?Destruction method: cryotherapy   ?Informed consent: discussed and consent obtained   ?Timeout:  patient name, date of birth, surgical site, and procedure verified ?Lesion destroyed using liquid nitrogen: Yes   ?Cryotherapy cycles:  3 ?Outcome: patient tolerated procedure well with no complications   ?Post-procedure details: wound care instructions given   ? ?Rosacea ?Right Malar Cheek ? ?May use topical clindamycin, contact me if this is not helpful ? ?Toenail fungus ?Right Foot - Anterior ? ?Topical clindamycin plus clotrimazole ? ?clindamycin (CLINDAGEL) 1 % gel - Right Foot - Anterior ?Apply to AA nightly ? ?Skin irritation ?Gluteal Crease ? ?Over the counter hydrocortisone cream daily after bathing for up to 1 month ? ? ? ? ? ?I, Lavonna Monarch, MD, have reviewed all documentation for this visit.  The documentation on 10/29/21 for the exam, diagnosis, procedures, and orders are all accurate and complete. ?

## 2021-11-05 ENCOUNTER — Ambulatory Visit: Payer: Medicare Other | Attending: Internal Medicine

## 2021-11-05 DIAGNOSIS — Z23 Encounter for immunization: Secondary | ICD-10-CM

## 2021-11-05 NOTE — Progress Notes (Signed)
? ?  Covid-19 Vaccination Clinic ? ?Name:  Dennis Diaz    ?MRN: [id number] ?DOB: 10-07-1945 ? ?11/05/2021 ? ?Mr. Bremer was observed post Covid-19 immunization for 15 minutes without incident. He was provided with Vaccine Information Sheet and instruction to access the V-Safe system.  ? ?Mr. Kanno was instructed to call 911 with any severe reactions post vaccine: ?Difficulty breathing  ?Swelling of face and throat  ?A fast heartbeat  ?A bad rash all over body  ?Dizziness and weakness  ? ?Immunizations Administered   ? ? Name Date Dose VIS Date Route  ? Ambulance person Booster 11/05/2021 11:17 AM 0.3 mL 02/27/2021 Intramuscular  ? Manufacturer: [place]: [phone]  ? [place]: [phone]  ? ?  ? ? ?

## 2021-11-07 ENCOUNTER — Other Ambulatory Visit (HOSPITAL_BASED_OUTPATIENT_CLINIC_OR_DEPARTMENT_OTHER): Payer: Self-pay

## 2021-11-07 MED ORDER — PFIZER COVID-19 VAC BIVALENT 30 MCG/0.3ML IM SUSP
INTRAMUSCULAR | 0 refills | Status: AC
  Filled 2021-11-07: qty 0.3, 1d supply, fill #0

## 2021-11-13 ENCOUNTER — Encounter: Payer: Self-pay | Admitting: Gastroenterology

## 2021-11-19 ENCOUNTER — Encounter: Payer: Self-pay | Admitting: Gastroenterology

## 2021-11-19 ENCOUNTER — Ambulatory Visit (AMBULATORY_SURGERY_CENTER): Payer: Medicare Other | Admitting: Gastroenterology

## 2021-11-19 VITALS — BP 91/62 | HR 56 | Temp 97.8°F | Resp 11 | Ht 74.0 in | Wt 219.0 lb

## 2021-11-19 DIAGNOSIS — D120 Benign neoplasm of cecum: Secondary | ICD-10-CM

## 2021-11-19 DIAGNOSIS — D122 Benign neoplasm of ascending colon: Secondary | ICD-10-CM | POA: Diagnosis not present

## 2021-11-19 DIAGNOSIS — Z86010 Personal history of colonic polyps: Secondary | ICD-10-CM | POA: Diagnosis not present

## 2021-11-19 MED ORDER — SODIUM CHLORIDE 0.9 % IV SOLN: 500.0000 mL | Freq: Once | INTRAVENOUS | Status: DC

## 2021-11-19 NOTE — Progress Notes (Signed)
Called to room to assist during endoscopic procedure.  Patient ID and intended procedure confirmed with present staff. Received instructions for my participation in the procedure from the performing physician.  

## 2021-11-19 NOTE — Progress Notes (Signed)
To pacu, VSS. Report to [email or web address] 

## 2021-11-19 NOTE — Progress Notes (Signed)
VS completed by CW.   Pt's states no medical or surgical changes since previsit or office visit.  

## 2021-11-19 NOTE — Op Note (Signed)
[facility] Patient Name: Dennis Diaz Procedure Date: 11/19/2021 7:57 AM MRN: [id number] Endoscopist: Mallie Mussel L. Loletha Carrow , MD Age: 76 Referring MD:  Date of Birth: Jun 16, 1946 Gender: Male Account #: [id number] Procedure:                Colonoscopy Indications:              Surveillance: Personal history of adenomatous                            polyps on last colonoscopy 5 years ago                           TA < 13m x 29 Oct 2016; small polyps 2013 Medicines:                Monitored Anesthesia Care Procedure:                Pre-Anesthesia Assessment:                           - Prior to the procedure, a History and Physical                            was performed, and patient medications and                            allergies were reviewed. The patient's tolerance of                            previous anesthesia was also reviewed. The risks                            and benefits of the procedure and the sedation                            options and risks were discussed with the patient.                            All questions were answered, and informed consent                            was obtained. Prior Anticoagulants: The patient has                            taken no previous anticoagulant or antiplatelet                            agents. ASA Grade Assessment: II - A patient with                            mild systemic disease. After reviewing the risks                            and benefits, the patient was deemed in  satisfactory condition to undergo the procedure.                           After obtaining informed consent, the colonoscope                            was passed under direct vision. Throughout the                            procedure, the patient's blood pressure, pulse, and                            oxygen saturations were monitored continuously. The                            CF [id number] #[id number] was introduced  through the anus                            and advanced to the the terminal ileum, with                            identification of the appendiceal orifice and IC                            valve. The colonoscopy was performed without                            difficulty. The patient tolerated the procedure                            well. The quality of the bowel preparation was                            good. The terminal ileum, ileocecal valve,                            appendiceal orifice, and rectum were photographed. Scope In: 8:06:41 AM Scope Out: 8:22:02 AM Scope Withdrawal Time: 0 hours 13 minutes 4 seconds  Total Procedure Duration: 0 hours 15 minutes 21 seconds  Findings:                 The perianal and digital rectal examinations were                            normal.                           Repeat examination of right colon under NBI                            performed.                           Two sessile and semi-sessile polyps were found in  the ascending colon and cecum. The polyps were                            diminutive in size. These polyps were removed with                            a cold snare. Resection and retrieval were complete.                           Multiple diverticula were found in the left colon.                           Internal hemorrhoids were found.                           The exam was otherwise without abnormality on                            direct and retroflexion views. Complications:            No immediate complications. Estimated Blood Loss:     Estimated blood loss was minimal. Impression:               - Two diminutive polyps in the ascending colon and                            in the cecum, removed with a cold snare. Resected                            and retrieved.                           - Diverticulosis in the left colon.                           - Internal hemorrhoids.                            - The examination was otherwise normal on direct                            and retroflexion views. Recommendation:           - Patient has a contact number available for                            emergencies. The signs and symptoms of potential                            delayed complications were discussed with the                            patient. Return to normal activities tomorrow.                            Written discharge instructions were provided to  the                            patient.                           - Resume previous diet.                           - Continue present medications.                           - Await pathology results.                           - No repeat surveillance colonoscopy due to age,                            current guidelines and low risk polyp history. Thatcher Doberstein L. Loletha Carrow, MD 11/19/2021 8:30:10 AM This report has been signed electronically.

## 2021-11-19 NOTE — Progress Notes (Signed)
History and Physical:  This patient presents for endoscopic testing for: Encounter Diagnosis  Name Primary?   Personal history of colonic polyps Yes    76 year old man here for surveillance colonoscopy. 2 diminutive adenomatous polyps in May 2018, same findings 2013. Patient denies chronic abdominal pain, rectal bleeding, constipation or diarrhea.   Patient is otherwise without complaints or active issues today.   Past Medical History: Past Medical History:  Diagnosis Date   Angio-edema    Diverticulitis    2016   ED (erectile dysfunction)    Hyperlipidemia    Kidney stones    2 episodes in 40 years    Urticaria      Past Surgical History: Past Surgical History:  Procedure Laterality Date   COLONOSCOPY     POLYPECTOMY     root canal     and redo   TONSILLECTOMY     as child    Allergies: Allergies  Allergen Reactions   Fire Ant    Fish Allergy Hives    Some fish protiens he is allergic to like hering and [employer]	[place]- a schromboid issue per pt. - fresh fish is okay     Outpatient Meds: Current Outpatient Medications  Medication Sig Dispense Refill   atorvastatin (LIPITOR) 40 MG tablet Take 40 mg by mouth daily.     clindamycin (CLINDAGEL) 1 % gel Apply to AA nightly (Patient not taking: Reported on 10/29/2021) 30 g 2   clobetasol cream (TEMOVATE) 7.34 % Apply 1 application. topically 2 (two) times daily. Apply to grovers 1-2 times daily for flare not for face or skin folds (Patient not taking: Reported on 10/29/2021) 60 g 5   Clobetasol Prop Emollient Base (CLOBETASOL PROPIONATE E) 0.05 % emollient cream Apply 1 application topically 2 (two) times daily. (Patient not taking: Reported on 10/29/2021) 60 g 7   COVID-19 mRNA bivalent vaccine, Pfizer, (PFIZER COVID-19 VAC BIVALENT) injection Inject into the muscle. (Patient not taking: Reported on 10/14/2021) 0.3 mL 0   COVID-19 mRNA bivalent vaccine, Pfizer, (PFIZER COVID-19 VAC BIVALENT) injection Inject into the muscle.  0.3 mL 0   COVID-19 mRNA Vac-TriS, Pfizer, (PFIZER-BIONT COVID-19 VAC-TRIS) SUSP injection Inject into the muscle. (Patient not taking: Reported on 10/14/2021) 0.3 mL 0   EPINEPHrine 0.3 mg/0.3 mL IJ SOAJ injection Inject into the muscle. (Patient not taking: Reported on 10/29/2021)     Current Facility-Administered Medications  Medication Dose Route Frequency Provider Last Rate Last Admin   0.9 %  sodium chloride infusion  500 mL Intravenous Continuous Danis, Estill Cotta III, MD       0.9 %  sodium chloride infusion  500 mL Intravenous Once Nelida Meuse III, MD          ___________________________________________________________________ Objective   Exam:  BP 114/81   Pulse 64   Temp 97.8 F (36.6 C) (Temporal)   Ht '6\' 2"'$  (1.88 m)   Wt 219 lb (99.3 kg)   SpO2 98%   BMI 28.12 kg/m   CV: RRR without murmur, S1/S2 Resp: clear to auscultation bilaterally, normal RR and effort noted GI: soft, no tenderness, with active bowel sounds.   Assessment: Encounter Diagnosis  Name Primary?   Personal history of colonic polyps Yes     Plan: Colonoscopy  The benefits and risks of the planned procedure were described in detail with the patient or (when appropriate) their health care proxy.  Risks were outlined as including, but not limited to, bleeding, infection, perforation, adverse medication reaction leading  to cardiac or pulmonary decompensation, pancreatitis (if ERCP).  The limitation of incomplete mucosal visualization was also discussed.  No guarantees or warranties were given.    The patient is appropriate for an endoscopic procedure in the ambulatory setting.   - Wilfrid Lund, MD

## 2021-11-19 NOTE — Patient Instructions (Signed)
YOU HAD AN ENDOSCOPIC PROCEDURE TODAY AT THE [facility]:   Refer to the procedure report that was given to you for any specific questions about what was found during the examination.  If the procedure report does not answer your questions, please call your gastroenterologist to clarify.  If you requested that your care partner not be given the details of your procedure findings, then the procedure report has been included in a sealed envelope for you to review at your convenience later.  **Handouts given on polyps, hemorrhoids and diverticulosis**  YOU SHOULD EXPECT: Some feelings of bloating in the abdomen. Passage of more gas than usual.  Walking can help get rid of the air that was put into your GI tract during the procedure and reduce the bloating. If you had a lower endoscopy (such as a colonoscopy or flexible sigmoidoscopy) you may notice spotting of blood in your stool or on the toilet paper. If you underwent a bowel prep for your procedure, you may not have a normal bowel movement for a few days.  Please Note:  You might notice some irritation and congestion in your nose or some drainage.  This is from the oxygen used during your procedure.  There is no need for concern and it should clear up in a day or so.  SYMPTOMS TO REPORT IMMEDIATELY:  Following lower endoscopy (colonoscopy or flexible sigmoidoscopy):  Excessive amounts of blood in the stool  Significant tenderness or worsening of abdominal pains  Swelling of the abdomen that is new, acute  Fever of 100F or higher  For urgent or emergent issues, a gastroenterologist can be reached at any hour by calling [phone]. Do not use MyChart messaging for urgent concerns.    DIET:  We do recommend a small meal at first, but then you may proceed to your regular diet.  Drink plenty of fluids but you should avoid alcoholic beverages for 24 hours.  ACTIVITY:  You should plan to take it easy for the rest of today and you  should NOT DRIVE or use heavy machinery until tomorrow (because of the sedation medicines used during the test).    FOLLOW UP: Our staff will call the number listed on your records 48-72 hours following your procedure to check on you and address any questions or concerns that you may have regarding the information given to you following your procedure. If we do not reach you, we will leave a message.  We will attempt to reach you two times.  During this call, we will ask if you have developed any symptoms of COVID 19. If you develop any symptoms (ie: fever, flu-like symptoms, shortness of breath, cough etc.) before then, please call [phone].  If you test positive for Covid 19 in the 2 weeks post procedure, please call and report this information to us.    If any biopsies were taken you will be contacted by phone or by letter within the next 1-3 weeks.  Please call us at [phone] if you have not heard about the biopsies in 3 weeks.    SIGNATURES/CONFIDENTIALITY: You and/or your care partner have signed paperwork which will be entered into your electronic medical record.  These signatures attest to the fact that that the information above on your After Visit Summary has been reviewed and is understood.  Full responsibility of the confidentiality of this discharge information lies with you and/or your care-partner.  

## 2021-11-20 ENCOUNTER — Telehealth: Payer: Self-pay

## 2021-11-20 NOTE — Telephone Encounter (Signed)
  Follow up Call-     11/19/2021    7:17 AM  Call back number  Post procedure Call Back phone  # [phone]  Permission to leave phone message Yes     Patient questions:  Do you have a fever, pain , or abdominal swelling? No. Pain Score  0 *  Have you tolerated food without any problems? Yes.    Have you been able to return to your normal activities? Yes.    Do you have any questions about your discharge instructions: Diet   No. Medications  No. Follow up visit  No.  Do you have questions or concerns about your Care? No.  Actions: * If pain score is 4 or above: No action needed, pain <4.

## 2021-11-20 NOTE — Telephone Encounter (Signed)
Follow up call placed, VM obtained and message left. ?SChaplin, RN,BSN ? ?

## 2021-11-22 ENCOUNTER — Encounter: Payer: Self-pay | Admitting: Gastroenterology

## 2022-04-21 ENCOUNTER — Other Ambulatory Visit (HOSPITAL_BASED_OUTPATIENT_CLINIC_OR_DEPARTMENT_OTHER): Payer: Self-pay

## 2022-04-21 MED ORDER — COMIRNATY 30 MCG/0.3ML IM SUSY
PREFILLED_SYRINGE | INTRAMUSCULAR | 0 refills | Status: AC
  Filled 2022-04-21: qty 0.3, 1d supply, fill #0

## 2022-07-16 ENCOUNTER — Encounter (HOSPITAL_COMMUNITY): Payer: Self-pay | Admitting: Internal Medicine

## 2022-07-17 ENCOUNTER — Other Ambulatory Visit (HOSPITAL_COMMUNITY): Payer: Self-pay | Admitting: Internal Medicine

## 2022-07-17 DIAGNOSIS — I719 Aortic aneurysm of unspecified site, without rupture: Secondary | ICD-10-CM

## 2022-08-01 ENCOUNTER — Ambulatory Visit (HOSPITAL_COMMUNITY)
Admission: RE | Admit: 2022-08-01 | Discharge: 2022-08-01 | Disposition: A | Discharge status: Home / Self Care | Payer: Medicare Other | Source: Ambulatory Visit | Attending: Internal Medicine | Admitting: Internal Medicine

## 2022-08-01 DIAGNOSIS — I719 Aortic aneurysm of unspecified site, without rupture: Secondary | ICD-10-CM | POA: Diagnosis not present

## 2022-08-01 MED ORDER — IOHEXOL 350 MG/ML SOLN
100.0000 mL | Freq: Once | INTRAVENOUS | Status: AC | PRN
  Administered 2022-08-01: 100 mL via INTRAVENOUS

## 2022-08-27 ENCOUNTER — Encounter: Payer: Self-pay | Admitting: Orthopaedic Surgery

## 2022-08-27 ENCOUNTER — Ambulatory Visit (INDEPENDENT_AMBULATORY_CARE_PROVIDER_SITE_OTHER): Payer: Medicare Other

## 2022-08-27 ENCOUNTER — Ambulatory Visit: Payer: Medicare Other | Admitting: Orthopaedic Surgery

## 2022-08-27 VITALS — BP 139/75 | HR 70 | Ht 74.0 in | Wt 215.0 lb

## 2022-08-27 DIAGNOSIS — G8929 Other chronic pain: Secondary | ICD-10-CM

## 2022-08-27 DIAGNOSIS — M25511 Pain in right shoulder: Secondary | ICD-10-CM | POA: Diagnosis not present

## 2022-08-27 DIAGNOSIS — M79675 Pain in left toe(s): Secondary | ICD-10-CM

## 2022-08-27 NOTE — Progress Notes (Signed)
Office Visit Note   Patient: Dennis Diaz           Date of Birth: [birth year]           MRN: [id number] Visit Date: 08/27/2022              Requested by: Sueanne Margarita, [address] [place],  [place] [address] PCP: Sueanne Margarita, DO   Assessment & Plan: Visit Diagnoses:  1. Great toe pain, left   2. Chronic right shoulder pain     Plan: Will obtain be met and uric acid I will call patient with results when they are available.  We discussed pathophysiology of his likely diagnosis of gout.  We discussed possible allopurinol treatment if his uric acid is elevated.  Follow-Up Instructions: No follow-ups on file.   Orders:  Orders Placed This Encounter  Procedures   XR Toe Great Left   XR Shoulder Right   Basic Metabolic Panel (BMET)   Uric acid   No orders of the defined types were placed in this encounter.     Procedures: No procedures performed   Clinical Data: No additional findings.   Subjective: Chief Complaint  Patient presents with   Left Great Toe - Pain   Right Shoulder - Pain    HPI 77 year old male with intermittent left great toe pain for several years bothers him a few times a year sometimes every couple months with swelling difficulty walking he takes Voltaren with improvement.  He is also had some pain in his right shoulder particularly with abduction outstretched weight lifting.  He states he walks the golf course each morning with a group and when his toes bothering him he is unable to participate.  He denies associated numbness or tingling in his hand.  No history of injury to his right shoulder.  He did consume a lot of seafood shellfish in [place] prior to his last episode of great toe pain swelling redness.  He states most of that is all resolved.  Review of Systems all systems noncontributory.  History of perforated diverticulum.   Objective: Vital Signs: BP 139/75   Pulse 70   Ht '6\' 2"'$  (1.88 m)   Wt 215 lb (97.5 kg)   BMI 27.60 kg/m    Physical Exam Constitutional:      Appearance: He is well-developed.  HENT:     Head: Normocephalic and atraumatic.     Right Ear: External ear normal.     Left Ear: External ear normal.  Eyes:     Pupils: Pupils are equal, round, and reactive to light.  Neck:     Thyroid: No thyromegaly.     Trachea: No tracheal deviation.  Cardiovascular:     Rate and Rhythm: Normal rate.  Pulmonary:     Effort: Pulmonary effort is normal.     Breath sounds: No wheezing.  Abdominal:     General: Bowel sounds are normal.     Palpations: Abdomen is soft.  Musculoskeletal:     Cervical back: Neck supple.  Skin:    General: Skin is warm and dry.     Capillary Refill: Capillary refill takes less than 2 seconds.  Neurological:     Mental Status: He is alert and oriented to person, place, and time.  Psychiatric:        Behavior: Behavior normal.        Thought Content: Thought content normal.        Judgment: Judgment normal.  Ortho Exam right great toe has mild erythema which is resolving.  Slight amount of medial skin peeling.  Good range of motion sensation the tip is intact.  Right shoulder shows positive impingement negative drop arm test negative subluxation.  Long head of the biceps is normal.  Full active and passive range of motion negative crossarm test.  Minimal tenderness acromioclavicular joint.  Specialty Comments:  No specialty comments available.  Imaging: No results found.   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   Perforated diverticulum of large intestine 04/15/2015   Diverticulitis of intestine with perforation without bleeding 04/15/2015   Past Medical History:  Diagnosis Date   Angio-edema    Diverticulitis    2016   ED  (erectile dysfunction)    Hyperlipidemia    Kidney stones    2 episodes in 40 years    Urticaria     Family History  Problem Relation Age of Onset   Alzheimer's disease Mother    Diverticulosis Mother    Heart disease Father 75       CABG   Diabetes Father    Colon polyps Sister    Colon cancer Neg Hx    Rectal cancer Neg Hx    Stomach cancer Neg Hx    Angioedema Neg Hx    Asthma Neg Hx    Atopy Neg Hx    Eczema Neg Hx    Immunodeficiency Neg Hx    Urticaria Neg Hx    Allergic rhinitis Neg Hx    Crohn's disease Neg Hx    Esophageal cancer Neg Hx     Past Surgical History:  Procedure Laterality Date   COLONOSCOPY     POLYPECTOMY     root canal     and redo   TONSILLECTOMY     as child   Social History   Occupational History   Occupation: VP of Operations    Employer: [employer]  Tobacco Use   Smoking status: Never   Smokeless tobacco: Never  Vaping Use   Vaping Use: Never used  Substance and Sexual Activity   Alcohol use: Yes    Alcohol/week: 4.0 standard drinks of alcohol    Types: 4 Glasses of wine per week    Comment: wine "couple times a week"   Drug use: No   Sexual activity: Yes    Partners: Female

## 2022-08-28 ENCOUNTER — Other Ambulatory Visit: Payer: Self-pay | Admitting: Orthopaedic Surgery

## 2022-08-28 LAB — BASIC METABOLIC PANEL
BUN: 16 mg/dL (ref 7–25)
CO2: 21 mmol/L (ref 20–32)
Calcium: 9.1 mg/dL (ref 8.6–10.3)
Chloride: 107 mmol/L (ref 98–110)
Creat: 0.99 mg/dL (ref 0.70–1.28)
Glucose, Bld: 98 mg/dL (ref 65–99)
Potassium: 4.6 mmol/L (ref 3.5–5.3)
Sodium: 141 mmol/L (ref 135–146)

## 2022-08-28 LAB — URIC ACID: Uric Acid, Serum: 7.7 mg/dL (ref 4.0–8.0)

## 2022-08-28 LAB — SPECIMEN COMPROMISED

## 2022-08-28 MED ORDER — ALLOPURINOL 100 MG PO TABS: 100.0000 mg | ORAL_TABLET | Freq: Every day | ORAL | 6 refills | Status: DC

## 2022-10-01 ENCOUNTER — Encounter (INDEPENDENT_AMBULATORY_CARE_PROVIDER_SITE_OTHER): Payer: Self-pay

## 2022-10-01 ENCOUNTER — Encounter (INDEPENDENT_AMBULATORY_CARE_PROVIDER_SITE_OTHER): Payer: Medicare Other | Admitting: Ophthalmology

## 2023-03-16 ENCOUNTER — Encounter: Payer: Self-pay | Admitting: Orthopaedic Surgery

## 2023-03-16 ENCOUNTER — Other Ambulatory Visit: Payer: Self-pay

## 2023-03-16 MED ORDER — ALLOPURINOL 100 MG PO TABS: 100.0000 mg | ORAL_TABLET | Freq: Every day | ORAL | 6 refills | Status: AC

## 2023-05-13 ENCOUNTER — Other Ambulatory Visit (HOSPITAL_BASED_OUTPATIENT_CLINIC_OR_DEPARTMENT_OTHER): Payer: Self-pay

## 2023-05-13 MED ORDER — COVID-19 MRNA VAC-TRIS(PFIZER) 30 MCG/0.3ML IM SUSY
0.3000 mL | PREFILLED_SYRINGE | Freq: Once | INTRAMUSCULAR | 0 refills | Status: AC
  Filled 2023-05-13: qty 0.3, 1d supply, fill #0

## 2023-07-21 ENCOUNTER — Other Ambulatory Visit (HOSPITAL_COMMUNITY): Payer: Self-pay | Admitting: Internal Medicine

## 2023-07-21 DIAGNOSIS — I712 Thoracic aortic aneurysm, without rupture, unspecified: Secondary | ICD-10-CM

## 2023-08-05 ENCOUNTER — Ambulatory Visit (HOSPITAL_COMMUNITY)
Admission: RE | Admit: 2023-08-05 | Discharge: 2023-08-05 | Disposition: A | Discharge status: Home / Self Care | Payer: Medicare Other | Source: Ambulatory Visit | Attending: Internal Medicine | Admitting: Internal Medicine

## 2023-08-05 DIAGNOSIS — I712 Thoracic aortic aneurysm, without rupture, unspecified: Secondary | ICD-10-CM | POA: Diagnosis present

## 2023-08-05 MED ORDER — IOHEXOL 350 MG/ML SOLN
80.0000 mL | Freq: Once | INTRAVENOUS | Status: AC | PRN
  Administered 2023-08-05: 80 mL via INTRAVENOUS

## 2023-09-24 ENCOUNTER — Other Ambulatory Visit (HOSPITAL_COMMUNITY): Payer: Self-pay | Admitting: Internal Medicine

## 2023-09-24 DIAGNOSIS — I7789 Other specified disorders of arteries and arterioles: Secondary | ICD-10-CM

## 2023-10-26 ENCOUNTER — Ambulatory Visit (HOSPITAL_COMMUNITY): Attending: Cardiovascular Disease

## 2023-10-26 DIAGNOSIS — I7789 Other specified disorders of arteries and arterioles: Secondary | ICD-10-CM | POA: Diagnosis not present

## 2023-10-26 DIAGNOSIS — I351 Nonrheumatic aortic (valve) insufficiency: Secondary | ICD-10-CM | POA: Insufficient documentation

## 2023-10-26 DIAGNOSIS — I77810 Thoracic aortic ectasia: Secondary | ICD-10-CM | POA: Diagnosis present

## 2023-10-26 LAB — ECHOCARDIOGRAM COMPLETE
Area-P 1/2: 2.42 cm2
P 1/2 time: 692 ms
S' Lateral: 4 cm

## 2024-03-02 ENCOUNTER — Other Ambulatory Visit (INDEPENDENT_AMBULATORY_CARE_PROVIDER_SITE_OTHER)

## 2024-03-02 ENCOUNTER — Encounter: Payer: Self-pay | Admitting: Orthopedic Surgery

## 2024-03-02 ENCOUNTER — Ambulatory Visit: Admitting: Orthopedic Surgery

## 2024-03-02 DIAGNOSIS — R200 Anesthesia of skin: Secondary | ICD-10-CM

## 2024-03-02 DIAGNOSIS — R202 Paresthesia of skin: Secondary | ICD-10-CM

## 2024-03-02 DIAGNOSIS — M5412 Radiculopathy, cervical region: Secondary | ICD-10-CM

## 2024-03-02 MED ORDER — PREDNISONE 5 MG (21) PO TBPK: ORAL_TABLET | ORAL | 0 refills | Status: AC

## 2024-03-02 NOTE — Progress Notes (Signed)
 Office Visit Note   Patient: Dennis Diaz           Date of Birth: 1946/02/01           MRN: [id number] Visit Date: 03/02/2024 Requested by: Valentin Skates, DO [address] [place],  [place] [address] PCP: Valentin Skates, DO  Subjective: Chief Complaint  Patient presents with   Other    Left shoulder pain with radicular arm pain and numbness/tingling in thumb and index fingers    HPI: Dennis Diaz is a 78 y.o. male who presents to the office reporting left shoulder pain of several months duration.  No known injury.  Patient is right-hand dominant.  Does have 3 weeks of numbness and tingling extending into the hand as well into the thumb and index finger.  Pain radiates down the arm.  Denies any discrete neck pain but does report scapular pain is tender to touch in this region.  Describes that shoulder rib range of motion.  He has increased pain as well as numbness and tingling when extending his neck and looking to the left.  No prior surgery to the neck or shoulder.  No grinding..                ROS: All systems reviewed are negative as they relate to the chief complaint within the history of present illness.  Patient denies fevers or chills.  Assessment & Plan: Visit Diagnoses:  1. Radiculopathy, cervical region   2. Numbness and tingling in left hand     Plan: Impression is several month history of left-sided radiculopathy.  Has clear symptoms relating to head position.  Denies any weakness.  Plan is Medrol Dosepak 6-day course with MRI cervical spine to evaluate left-sided C5-6 radiculopathy.  Follow-up after those studies.  Follow-Up Instructions: No follow-ups on file.   Orders:  Orders Placed This Encounter  Procedures   XR Cervical Spine 2 or 3 views   No orders of the defined types were placed in this encounter.     Procedures: No procedures performed   Clinical Data: No additional findings.  Objective: Vital Signs: There were no vitals taken for this  visit.  Physical Exam:  Constitutional: Patient appears well-developed HEENT:  Head: Normocephalic Eyes:EOM are normal Neck: Normal range of motion Cardiovascular: Normal rate Pulmonary/chest: Effort normal Neurologic: Patient is alert Skin: Skin is warm Psychiatric: Patient has normal mood and affect  Ortho Exam: Ortho exam demonstrates extension about 30 degrees flexion chin chest with rotation to the left he does have reproduction of symptoms going down the back of his arm into digits 1 and 2 on the left-hand side.  Reflexes symmetric 0 to 1+ out of 4 bilateral biceps and triceps.  Grip strength is intact bilaterally.  EPL FPL interosseous strength is intact.  No masses lymphadenopathy or skin changes noted in that shoulder girdle region.  Left shoulder range of motion is symmetric to the right shoulder range of motion with excellent rotator cuff strength.  No coarseness or grinding with internal/external rotation of that left shoulder at 90 degrees of abduction.  No discrete AC joint tenderness is present.  Specialty Comments:  No specialty comments available.  Imaging: No results found.   PMFS History: Patient Active Problem List   Diagnosis Date Noted   Cataract, nuclear sclerotic, both eyes 05/21/2021   Retinal holes, left 05/21/2021   Retinal hole, left 01/10/2021   Retinal hole of right eye 01/10/2021   Anaphylactic reaction due to  food 04/25/2020   Perforated diverticulum of large intestine 04/15/2015   Diverticulitis of intestine with perforation without bleeding 04/15/2015   Past Medical History:  Diagnosis Date   Angio-edema    Diverticulitis    2016   ED (erectile dysfunction)    Hyperlipidemia    Kidney stones    2 episodes in 40 years    Urticaria     Family History  Problem Relation Age of Onset   Alzheimer's disease Mother    Diverticulosis Mother    Heart disease Father 79       CABG   Diabetes Father    Colon polyps Sister    Colon cancer Neg  Hx    Rectal cancer Neg Hx    Stomach cancer Neg Hx    Angioedema Neg Hx    Asthma Neg Hx    Atopy Neg Hx    Eczema Neg Hx    Immunodeficiency Neg Hx    Urticaria Neg Hx    Allergic rhinitis Neg Hx    Crohn's disease Neg Hx    Esophageal cancer Neg Hx     Past Surgical History:  Procedure Laterality Date   COLONOSCOPY     POLYPECTOMY     root canal     and redo   TONSILLECTOMY     as child   Social History   Occupational History   Occupation: VP of Operations    Employer: [employer]  Tobacco Use   Smoking status: Never   Smokeless tobacco: Never  Vaping Use   Vaping status: Never Used  Substance and Sexual Activity   Alcohol use: Yes    Alcohol/week: 4.0 standard drinks of alcohol    Types: 4 Glasses of wine per week    Comment: wine couple times a week   Drug use: No   Sexual activity: Yes    Partners: Female

## 2024-03-06 ENCOUNTER — Encounter: Payer: Self-pay | Admitting: Orthopedic Surgery

## 2024-03-07 ENCOUNTER — Encounter: Payer: Self-pay | Admitting: Orthopedic Surgery

## 2024-03-20 ENCOUNTER — Ambulatory Visit (HOSPITAL_BASED_OUTPATIENT_CLINIC_OR_DEPARTMENT_OTHER)
Admission: RE | Admit: 2024-03-20 | Discharge: 2024-03-20 | Disposition: A | Discharge status: Home / Self Care | Source: Ambulatory Visit | Attending: Orthopedic Surgery | Admitting: Orthopedic Surgery

## 2024-03-20 DIAGNOSIS — M5412 Radiculopathy, cervical region: Secondary | ICD-10-CM | POA: Diagnosis present

## 2024-03-20 DIAGNOSIS — R200 Anesthesia of skin: Secondary | ICD-10-CM | POA: Diagnosis present

## 2024-03-20 DIAGNOSIS — R202 Paresthesia of skin: Secondary | ICD-10-CM | POA: Diagnosis present

## 2024-03-22 ENCOUNTER — Ambulatory Visit: Payer: Self-pay | Admitting: Orthopedic Surgery

## 2024-03-22 ENCOUNTER — Other Ambulatory Visit: Payer: Self-pay | Admitting: Orthopedic Surgery

## 2024-03-22 DIAGNOSIS — M5412 Radiculopathy, cervical region: Secondary | ICD-10-CM

## 2024-03-22 NOTE — Progress Notes (Signed)
 Thx [name] - [name] can you let him know thx

## 2024-03-22 NOTE — Progress Notes (Signed)
 Also [name] can you cancel his appointment for tomorrow.  Thanks with me

## 2024-03-22 NOTE — Progress Notes (Signed)
 Hi [name].  I just talked with [name].  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  Can you see if you can get him into see Dr. Georgina whenever Dr. Georgina feels like it is appropriate based on the scan.  [name] would you mind looking at the scan.  Having predominantly left-sided symptoms but his stenosis looks pretty compelling overall at C6-7 and C5-6.  Thanks

## 2024-03-23 ENCOUNTER — Ambulatory Visit: Admitting: Orthopedic Surgery

## 2024-03-31 NOTE — Therapy (Signed)
 OUTPATIENT PHYSICAL THERAPY CERVICAL EVALUATION   Patient Name: Dennis Diaz MRN: [id number] DOB:08-31-1945, 78 y.o., male Today's Date: 04/01/2024  END OF SESSION:  PT End of Session - 04/01/24 1145     Visit Number 1    Number of Visits 17    Date for Recertification  05/27/24    PT Start Time 0845    PT Stop Time 0923    PT Time Calculation (min) 38 min    Activity Tolerance Patient tolerated treatment well    Behavior During Therapy [facility] for tasks assessed/performed          Past Medical History:  Diagnosis Date   Angio-edema    Diverticulitis    2016   ED (erectile dysfunction)    Hyperlipidemia    Kidney stones    2 episodes in 40 years    Urticaria    Past Surgical History:  Procedure Laterality Date   COLONOSCOPY     POLYPECTOMY     root canal     and redo   TONSILLECTOMY     as child   Patient Active Problem List   Diagnosis Date Noted   Cataract, nuclear sclerotic, both eyes 05/21/2021   Retinal holes, left 05/21/2021   Retinal hole, left 01/10/2021   Retinal hole of right eye 01/10/2021   Anaphylactic reaction due to food 04/25/2020   Perforated diverticulum of large intestine 04/15/2015   Diverticulitis of intestine with perforation without bleeding 04/15/2015    PCP: Valentin Skates, DO   REFERRING PROVIDER: Georgina Ozell LABOR, MD   REFERRING DIAG: M54.12 (ICD-10-CM) - Radiculopathy, cervical region   THERAPY DIAG:  Radiculopathy, cervical region  Cervicalgia  Abnormal posture  Rationale for Evaluation and Treatment: Rehabilitation  ONSET DATE: couple months ago  SUBJECTIVE:                                                                                                                                                                                                         SUBJECTIVE STATEMENT: Pt reports L shoulder pain that started about 3 months ago.  Then it progressed to tingling/numbness in the thumb and index finger.  He had  a prednisone  pack to decrease pain.  Has a followup with spine MD soon.    Hand dominance: Right  PERTINENT HISTORY:  --  PAIN:  Are you having pain? Yes: NPRS scale: 3/10-8/10 Pain location: L UT  Pain description: dull, tingling, numbness Aggravating factors: sitting, posture Relieving factors: medication  PRECAUTIONS: None  RED FLAGS: None     WEIGHT  BEARING RESTRICTIONS: No  FALLS:  Has patient fallen in last 6 months? No  LIVING ENVIRONMENT: Lives with: lives with their spouse Lives in: House/apartment  OCCUPATION: retired, working out, golf  PLOF: Independent  PATIENT GOALS: Decrease symptoms  NEXT MD VISIT: unknown  OBJECTIVE:  Note: Objective measures were completed at Evaluation unless otherwise noted.  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    PATIENT SURVEYS:  PSFS: THE PATIENT SPECIFIC FUNCTIONAL SCALE  Place score of 0-10 (0 = unable to perform activity and 10 = able to perform activity at the same level as before injury or problem)  Activity Date: 04/01/24    Overhead reaching with L 9    2.Golf 9    3.exercise at gym   5    4.      Total Score 7.66      Total Score = Sum of activity scores/number of activities  Minimally Detectable Change: 3 points (for single activity); 2 points (for average score)  [facility] (nd). The Patient Specific Functional Scale . Retrieved from [email or web address]   COGNITION: Overall cognitive status: Within functional limits for tasks assessed  SENSATION: L  hand tingling--at thumb and 1st digit  POSTURE: mod forward head and rounded shoulders   PALPATION: 1+ medial UT   CERVICAL ROM:    AROM A/PROM  eval  Flexion 75%  Extension 50%P  Right lateral flexion 25%  Left lateral flexion 50%P  Right rotation 75%  Left rotation 50%   (Blank rows = not tested)  UPPER EXTREMITY ROM:  Active ROM Right eval Left eval  Shoulder flexion    Shoulder extension    Shoulder abduction    Shoulder adduction    Shoulder extension    Shoulder internal rotation  50%  Shoulder external rotation    Elbow flexion    Elbow extension    Wrist flexion    Wrist extension    Wrist ulnar deviation    Wrist radial deviation    Wrist pronation    Wrist supination     (Blank rows = not tested)  UPPER EXTREMITY MMT:  MMT Right eval Left eval  Shoulder flexion    Shoulder extension    Shoulder abduction    Shoulder adduction    Shoulder extension    Shoulder internal rotation    Shoulder external rotation    Middle trapezius    Lower trapezius    Elbow flexion  5-  Elbow extension  5-  Wrist flexion    Wrist extension    Wrist ulnar deviation    Wrist radial deviation    Wrist pronation    Wrist supination    Grip strength 94 ftlbs 91.6 ftlbs   (Blank rows = not tested)  CERVICAL SPECIAL TESTS:  Spurling's test: Positive, Distraction test: Positive, and Compression Test neg  FUNCTIONAL TESTS:   none  TREATMENT DATE:  04/01/24  Initial evaluation completed followed by trial set of HEP and manualtraction.  PATIENT EDUCATION:  Education details: HEP Person educated: Patient Education method: Explanation, Demonstration, and Actor cues Education comprehension: verbalized understanding and returned demonstration  HOME EXERCISE PROGRAM: Access Code: [email or web address] URL: [email or web address] Date:  04/01/2024 Prepared by: Burnard Meth  Exercises - Supine Cervical Rotation AROM on Pillow  - 2 x daily - 7 x weekly - 2 sets - 8 reps  ASSESSMENT:  CLINICAL IMPRESSION: Patient is a 78 y.o. male who was seen today for physical therapy evaluation and treatment for cervical radiculopathy on the left side. He presents with tingling/numbness in the L hand, pain in the L shoulder, restricted cervical motion and restricted ADL/exercise programs. He would benefit from skilled PT to address these issues and return to previous LOA symptom free.   OBJECTIVE IMPAIRMENTS: decreased ROM, impaired flexibility, and pain.   ACTIVITY LIMITATIONS: computer work  PARTICIPATION LIMITATIONS: community activity and exercise  PERSONAL FACTORS: None- are also affecting patient's functional outcome.   REHAB POTENTIAL: Good  CLINICAL DECISION MAKING: Stable/uncomplicated  EVALUATION COMPLEXITY: Moderate   GOALS: Goals reviewed with patient? Yes  SHORT TERM GOALS: Target date: 04/22/2024    Pt to be independent with HEP. Baseline:  Goal status: INITIAL  2.  Decrease pain by 1 level. Baseline:  Goal status: INITIAL  LONG TERM GOALS: Target date:05/27/2024  Centralize radiating symptoms by 50% or more. Baseline:  Goal status: INITIAL  2.  Pt to be independent with self progressive HEP by discharge.  Baseline:  Goal status: INITIAL  3.  Decrease max pain to 2/10 with all activities. Baseline:  Goal status: INITIAL  4.  Increase cervical AROM by 25%. Baseline:  Goal status: INITIAL  PLAN:  PT FREQUENCY: 1-2x/week  PT DURATION: 8 weeks  PLANNED INTERVENTIONS: 97164- PT Re-evaluation, 97110-Therapeutic exercises, 97530- Therapeutic activity, [id number]- Neuromuscular re-education, 97535- Self Care, 02859- Manual therapy, 02987- Traction (mechanical), Patient/Family education, Joint mobilization, and Moist heat  PLAN FOR NEXT SESSION: Start on postural exercises.   Burnard Meth,  PT 04/01/24  11:47 AM     Date of referral: 03/22/24 Referring provider: Georgina Ozell LABOR, MD  Referring diagnosis?  Diagnosis  M54.12 (ICD-10-CM) - Radiculopathy, cervical region   Treatment diagnosis? (if different than referring diagnosis) m54.12, m54.2 , r29.3  What was this (referring dx) caused by? Unspecified  Nature of Condition: Initial Onset (within last 3 months)   Laterality: Lt  Current Functional Measure Score: Patient Specific Functional Scale 7.66  Objective measurements identify impairments when they are compared to normal values, the uninvolved extremity, and prior level of function.  [x]  Yes  []  No  Objective assessment of functional ability: Minimal functional limitations   Briefly describe symptoms: See Above  How did symptoms start: No cause  Average pain intensity:  Last 24 hours: 3/10  Past week: 8/10  How often does the pt experience symptoms? Occasionally  How much have the symptoms interfered with usual daily activities? Moderately  How has condition changed since care began at this facility? NA - initial visit  In general, how is the patients overall health? Very Good   BACK PAIN (STarT Back Screening Tool) No    Burnard CHRISTELLA Meth, PT 04/01/2024, 11:47 AM

## 2024-04-01 ENCOUNTER — Ambulatory Visit

## 2024-04-01 ENCOUNTER — Other Ambulatory Visit: Payer: Self-pay

## 2024-04-01 DIAGNOSIS — M5412 Radiculopathy, cervical region: Secondary | ICD-10-CM | POA: Diagnosis not present

## 2024-04-01 DIAGNOSIS — M542 Cervicalgia: Secondary | ICD-10-CM

## 2024-04-01 DIAGNOSIS — R293 Abnormal posture: Secondary | ICD-10-CM | POA: Diagnosis not present

## 2024-04-11 ENCOUNTER — Ambulatory Visit: Admitting: Orthopedic Surgery

## 2024-04-11 ENCOUNTER — Encounter: Payer: Self-pay | Admitting: Orthopedic Surgery

## 2024-04-11 ENCOUNTER — Other Ambulatory Visit (INDEPENDENT_AMBULATORY_CARE_PROVIDER_SITE_OTHER)

## 2024-04-11 VITALS — BP 163/85 | HR 73 | Ht 74.0 in | Wt 224.8 lb

## 2024-04-11 DIAGNOSIS — M542 Cervicalgia: Secondary | ICD-10-CM

## 2024-04-11 NOTE — Progress Notes (Signed)
 Orthopedic Spine Surgery Office Note  Assessment: Patient is a 78 y.o. male with left arm, forearm, and thumb/index finger numbness/paresthesias. No symptoms of myelopathy   Plan: -Patient has radiographic evidence of cord compression with T2 cord signal change on his MRI. He also has some physical exam findings consistent with myelopathy. However, he has no symptoms so did not recommend surgery at this time. Discussed monitoring for now -In regards to his numbness/paresthesias into the left upper extremity this seems more radicular in nature. He is working with PT. I encouraged him to continue with that treatment. If that does not make it better, next steps would be lyrica or cervical ESI -Patient has tried PT, tylenol , prednisone  -Patient should return to office in 3 months, x-rays at next visit: none   Patient expressed understanding of the plan and all questions were answered to the patient's satisfaction.   ___________________________________________________________________________   History:  Patient is a 78 y.o. male who presents today for cervical spine. Patient has had recent onset of left shoulder pain within the last couple of months. The pain was felt along the posterior aspect of the shoulder. Shortly after this developed, he noticed numbness/paresthesias in his left hand. He felt it mostly in the thumb and index finger. The shoulder pain has been getting better with time. The numbness/paresthesias has been getting worse. He now notes it in the lateral arm and dorsal forearm. He does not notice it constantly. He notices it with change in position at the neck, particularly extension. There was no trauma or injury that preceded the onset of these symptoms.    Weakness: denies Difficulty with fine motor skills (e.g., buttoning shirts, handwriting): denies Symptoms of imbalance: denies Paresthesias and numbness: yes, into left lateral arm, dorsal forearm, and radial hand Bowel or  bladder incontinence: denies Saddle anesthesia: denies  Treatments tried: PT, tylenol , prednisone   Review of systems: Denies fevers and chills, night sweats, unexplained weight loss, history of cancer, pain that wakes him at night  Past medical history: Angioedema ED HLD  Allergies: NKDA  Past surgical history:  Tonsillectomy Polypectomy  Social history: Denies use of nicotine product (smoking, vaping, patches, smokeless) Alcohol use: Yes, approximately 3-4 drinks per week Denies recreational drug use   Physical Exam:  BMI of 28.9  General: no acute distress, appears stated age Neurologic: alert, answering questions appropriately, following commands Respiratory: unlabored breathing on room air, symmetric chest rise Psychiatric: appropriate affect, normal cadence to speech   MSK (spine):  -Strength exam      Left  Right Grip strength                5/5  5/5 Interosseus   5/5   5/5 Wrist extension  5/5  5/5 Wrist flexion   5/5  5/5 Elbow flexion   5/5  5/5 Deltoid    5/5  5/5  EHL    5/5  5/5 TA    5/5  5/5 GSC    5/5  5/5 Knee extension  5/5  5/5 Hip flexion   5/5  5/5  -Sensory exam    Sensation intact to light touch in L3-S1 nerve distributions of bilateral lower extremities  Sensation intact to light touch in C5-T1 nerve distributions of bilateral upper extremities  -Brachioradialis DTR: 2/4 on the left, 2/4 on the right -Biceps DTR: 2/4 on the left, 2/4 on the right -Achilles DTR: 2/4 on the left, 2/4 on the right -Patellar tendon DTR: 3/4 on the left, 3/4 on the  right  -Spurling: positive on the left, negative on the right -Hoffman sign: positive on the right, negative on the left -Clonus: no beats bilaterally -Interosseous wasting: none seen -Grip and release test: negative -Romberg: negative  -Gait: normal  Tinel's at wrist: negative bilaterally Phalen's at wrist: negative bilaterally Durkan's: negative bilaterally   Tinel's at elbow:  negative bilaterally  Imaging: XRs of the cervical spine from 04/11/2024 was independently reviewed and interpreted, showing disc height loss with anterior osteophyte formation at C5/6.  Disc height loss at C6/7.  No evidence of instability on flexion/extension views.  No fracture or dislocation seen.  MRI of the cervical spine from 03/20/2024 was independently reviewed and interpreted, showing congenital stenosis. Foraminal stenosis on the left at C3/4. Central and bilateral foraminal stenosis at C5/6. Central and foraminal stenosis on the left at C6/7 due to a paracentral disc herniation. T2 cord signal change seen posterior to the C5/6 disc space.    Patient name: Dennis Diaz Patient MRN: [id number] Date of visit: 04/11/24

## 2024-04-14 ENCOUNTER — Ambulatory Visit (INDEPENDENT_AMBULATORY_CARE_PROVIDER_SITE_OTHER): Payer: Self-pay

## 2024-04-14 DIAGNOSIS — M5412 Radiculopathy, cervical region: Secondary | ICD-10-CM | POA: Diagnosis not present

## 2024-04-14 DIAGNOSIS — M542 Cervicalgia: Secondary | ICD-10-CM | POA: Diagnosis not present

## 2024-04-14 DIAGNOSIS — R293 Abnormal posture: Secondary | ICD-10-CM

## 2024-04-14 NOTE — Therapy (Signed)
 OUTPATIENT PHYSICAL THERAPY CERVICAL TREATMENT   Patient Name: Dennis Diaz MRN: [id number] DOB:Jun 11, 1946, 78 y.o., male Today's Date: 04/15/2024  END OF SESSION:  PT End of Session - 04/15/24 0752     Visit Number 2    Number of Visits 17    Date for Recertification  05/27/24    PT Start Time 1515    PT Stop Time 1555    PT Time Calculation (min) 40 min    Activity Tolerance Patient tolerated treatment well    Behavior During Therapy [facility] for tasks assessed/performed           Past Medical History:  Diagnosis Date   Angio-edema    Diverticulitis    2016   ED (erectile dysfunction)    Hyperlipidemia    Kidney stones    2 episodes in 40 years    Urticaria    Past Surgical History:  Procedure Laterality Date   COLONOSCOPY     POLYPECTOMY     root canal     and redo   TONSILLECTOMY     as child   Patient Active Problem List   Diagnosis Date Noted   Cataract, nuclear sclerotic, both eyes 05/21/2021   Retinal holes, left 05/21/2021   Retinal hole, left 01/10/2021   Retinal hole of right eye 01/10/2021   Anaphylactic reaction due to food 04/25/2020   Perforated diverticulum of large intestine 04/15/2015   Diverticulitis of intestine with perforation without bleeding 04/15/2015    PCP: Valentin Skates, DO   REFERRING PROVIDER: Georgina Ozell LABOR, MD   REFERRING DIAG: M54.12 (ICD-10-CM) - Radiculopathy, cervical region   THERAPY DIAG:  Radiculopathy, cervical region  Cervicalgia  Abnormal posture  Rationale for Evaluation and Treatment: Rehabilitation  ONSET DATE: couple months ago  SUBJECTIVE:                                                                                                                                                                                                         SUBJECTIVE STATEMENT: Pt reports seeing Dr Georgina Monday.  Plan is to do PT and possible injections later if needed.  HEP has helped with the symptoms. Hand  dominance: Right  PERTINENT HISTORY:  --  PAIN:  Are you having pain? Yes: NPRS scale: 1/10 Pain location: L UT  Pain description: dull, tingling, numbness Aggravating factors: sitting, posture Relieving factors: medication  PRECAUTIONS: None  RED FLAGS: None     WEIGHT BEARING RESTRICTIONS: No  FALLS:  Has patient fallen in last 6 months? No  LIVING ENVIRONMENT:  Lives with: lives with their spouse Lives in: House/apartment  OCCUPATION: retired, working out, golf  PLOF: Independent  PATIENT GOALS: Decrease symptoms  NEXT MD VISIT: unknown  OBJECTIVE:  Note: Objective measures were completed at Evaluation unless otherwise noted.  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    PATIENT SURVEYS:  PSFS: THE PATIENT SPECIFIC FUNCTIONAL SCALE  Place score of 0-10 (0 = unable to perform activity and 10 = able to perform activity at the same level as before injury or problem)  Activity Date: 04/01/24    Overhead reaching with L 9    2.Golf 9    3.exercise at gym   5    4.      Total Score 7.66      Total Score = Sum of activity scores/number of activities  Minimally Detectable Change: 3 points (for single activity); 2 points (for average score)  [employer] [facility] [facility] (nd). The Patient Specific Functional Scale . Retrieved from [email or web address]   COGNITION: Overall cognitive status: Within functional limits for tasks assessed  SENSATION: L hand tingling--at thumb and 1st digit  POSTURE: mod forward head and rounded  shoulders   PALPATION: 1+ medial UT   CERVICAL ROM:    AROM A/PROM  eval  Flexion 75%  Extension 50%P  Right lateral flexion 25%  Left lateral flexion 50%P  Right rotation 75%  Left rotation 50%   (Blank rows = not tested)  UPPER EXTREMITY ROM:  Active ROM Right eval Left eval  Shoulder flexion    Shoulder extension    Shoulder abduction    Shoulder adduction    Shoulder extension    Shoulder internal rotation  50%  Shoulder external rotation    Elbow flexion    Elbow extension    Wrist flexion    Wrist extension    Wrist ulnar deviation    Wrist radial deviation    Wrist pronation    Wrist supination     (Blank rows = not tested)  UPPER EXTREMITY MMT:  MMT Right eval Left eval  Shoulder flexion    Shoulder extension    Shoulder abduction    Shoulder adduction    Shoulder extension    Shoulder internal rotation    Shoulder external rotation    Middle trapezius    Lower trapezius    Elbow flexion  5-  Elbow extension  5-  Wrist flexion    Wrist extension    Wrist ulnar deviation    Wrist radial deviation    Wrist pronation    Wrist supination    Grip strength 94 ftlbs 91.6 ftlbs   (Blank rows = not tested)  CERVICAL SPECIAL TESTS:  Spurling's test: Positive, Distraction test: Positive, and Compression Test neg  FUNCTIONAL TESTS:   none  TREATMENT DATE:  04/14/24 Cervical UBE 3/3 Tband rows, extension, LPD  blue 3x10 Manual  Cervical SOR, manual traction, UT stretching   04/01/24  Initial evaluation completed followed by trial set of HEP and manualtraction.  PATIENT EDUCATION:  Education details: HEP Person educated: Patient Education method: Explanation, Demonstration, and Actor cues Education comprehension: verbalized understanding and returned demonstration  HOME EXERCISE PROGRAM: Access Code:  [email or web address] URL: [email or web address] Date: 04/01/2024 Prepared by: Burnard Meth  Exercises - Supine Cervical Rotation AROM on Pillow  - 2 x daily - 7 x weekly - 2 sets - 8 reps  ASSESSMENT:  CLINICAL IMPRESSION: Patient needed VC and tactile cues for proper form with T band exercises.  OBJECTIVE IMPAIRMENTS: decreased ROM, impaired flexibility, and pain.   ACTIVITY LIMITATIONS: computer work  PARTICIPATION LIMITATIONS: community activity and exercise  PERSONAL FACTORS: None- are also affecting patient's functional outcome.   REHAB POTENTIAL: Good  CLINICAL DECISION MAKING: Stable/uncomplicated  EVALUATION COMPLEXITY: Moderate   GOALS: Goals reviewed with patient? Yes  SHORT TERM GOALS: Target date: 04/22/2024    Pt to be independent with HEP. Baseline:  Goal status: INITIAL  2.  Decrease pain by 1 level. Baseline:  Goal status: INITIAL  LONG TERM GOALS: Target date:05/27/2024  Centralize radiating symptoms by 50% or more. Baseline:  Goal status: INITIAL  2.  Pt to be independent with self progressive HEP by discharge.  Baseline:  Goal status: INITIAL  3.  Decrease max pain to 2/10 with all activities. Baseline:  Goal status: INITIAL  4.  Increase cervical AROM by 25%. Baseline:  Goal status: INITIAL  PLAN:  PT FREQUENCY: 1-2x/week  PT DURATION: 8 weeks  PLANNED INTERVENTIONS: 97164- PT Re-evaluation, 97110-Therapeutic exercises, 97530- Therapeutic activity, [id number]- Neuromuscular re-education, 97535- Self Care, 02859- Manual therapy, 02987- Traction (mechanical), Patient/Family education, Joint mobilization, and Moist heat  PLAN FOR NEXT SESSION: Continue on postural exercises.   Burnard Meth, PT 04/15/24  7:54 AM     Date of referral: 03/22/24 Referring provider: Georgina Ozell LABOR, MD  Referring diagnosis?  Diagnosis  M54.12 (ICD-10-CM) - Radiculopathy, cervical region   Treatment diagnosis? (if different than referring  diagnosis) m54.12, m54.2 , r29.3  What was this (referring dx) caused by? Unspecified  Nature of Condition: Initial Onset (within last 3 months)   Laterality: Lt  Current Functional Measure Score: Patient Specific Functional Scale 7.66  Objective measurements identify impairments when they are compared to normal values, the uninvolved extremity, and prior level of function.  [x]  Yes  []  No  Objective assessment of functional ability: Minimal functional limitations   Briefly describe symptoms: See Above  How did symptoms start: No cause  Average pain intensity:  Last 24 hours: 3/10  Past week: 8/10  How often does the pt experience symptoms? Occasionally  How much have the symptoms interfered with usual daily activities? Moderately  How has condition changed since care began at this facility? NA - initial visit  In general, how is the patients overall health? Very Good   BACK PAIN (STarT Back Screening Tool) No    Burnard CHRISTELLA Meth, PT 04/15/2024, 7:54 AM

## 2024-04-16 NOTE — Therapy (Signed)
 OUTPATIENT PHYSICAL THERAPY CERVICAL TREATMENT   Patient Name: Dennis Diaz MRN: [id number] DOB:Feb 02, 1946, 78 y.o., male Today's Date: 04/18/2024  END OF SESSION:  PT End of Session - 04/18/24 1217     Visit Number 3    Number of Visits 17    Date for Recertification  05/27/24    PT Start Time 1145    PT Stop Time 1215    PT Time Calculation (min) 30 min    Activity Tolerance Patient tolerated treatment well    Behavior During Therapy [facility] for tasks assessed/performed            Past Medical History:  Diagnosis Date   Angio-edema    Diverticulitis    2016   ED (erectile dysfunction)    Hyperlipidemia    Kidney stones    2 episodes in 40 years    Urticaria    Past Surgical History:  Procedure Laterality Date   COLONOSCOPY     POLYPECTOMY     root canal     and redo   TONSILLECTOMY     as child   Patient Active Problem List   Diagnosis Date Noted   Cataract, nuclear sclerotic, both eyes 05/21/2021   Retinal holes, left 05/21/2021   Retinal hole, left 01/10/2021   Retinal hole of right eye 01/10/2021   Anaphylactic reaction due to food 04/25/2020   Perforated diverticulum of large intestine 04/15/2015   Diverticulitis of intestine with perforation without bleeding 04/15/2015    PCP: Valentin Skates, DO   REFERRING PROVIDER: Georgina Ozell LABOR, MD   REFERRING DIAG: M54.12 (ICD-10-CM) - Radiculopathy, cervical region   THERAPY DIAG:  Radiculopathy, cervical region  Cervicalgia  Abnormal posture  Rationale for Evaluation and Treatment: Rehabilitation  ONSET DATE: couple months ago  SUBJECTIVE:                                                                                                                                                                                                         SUBJECTIVE STATEMENT: Pt reports tingling/numbness has diminished since starting. Shoulder pain is gone.   Hand dominance: Right  PERTINENT HISTORY:   --  PAIN:  Are you having pain? Yes: NPRS scale: 1/10 Pain location: L UT  Pain description: dull, tingling, numbness Aggravating factors: sitting, posture Relieving factors: medication  PRECAUTIONS: None  RED FLAGS: None     WEIGHT BEARING RESTRICTIONS: No  FALLS:  Has patient fallen in last 6 months? No  LIVING ENVIRONMENT: Lives with: lives with their spouse Lives in: House/apartment  OCCUPATION:  retired, working out, golf  PLOF: Independent  PATIENT GOALS: Decrease symptoms  NEXT MD VISIT: unknown  OBJECTIVE:  Note: Objective measures were completed at Evaluation unless otherwise noted.  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    PATIENT SURVEYS:  PSFS: THE PATIENT SPECIFIC FUNCTIONAL SCALE  Place score of 0-10 (0 = unable to perform activity and 10 = able to perform activity at the same level as before injury or problem)  Activity Date: 04/01/24    Overhead reaching with L 9    2.Golf 9    3.exercise at gym   5    4.      Total Score 7.66      Total Score = Sum of activity scores/number of activities  Minimally Detectable Change: 3 points (for single activity); 2 points (for average score)  [facility] (nd). The Patient Specific Functional Scale . Retrieved from [email or web address]   COGNITION: Overall cognitive status: Within functional limits for tasks assessed  SENSATION: L hand tingling--at thumb and 1st digit  POSTURE: mod forward head and rounded shoulders   PALPATION: 1+ medial  UT   CERVICAL ROM:    AROM A/PROM  eval  Flexion 75%  Extension 50%P  Right lateral flexion 25%  Left lateral flexion 50%P  Right rotation 75%  Left rotation 50%   (Blank rows = not tested)  UPPER EXTREMITY ROM:  Active ROM Right eval Left eval  Shoulder flexion    Shoulder extension    Shoulder abduction    Shoulder adduction    Shoulder extension    Shoulder internal rotation  50%  Shoulder external rotation    Elbow flexion    Elbow extension    Wrist flexion    Wrist extension    Wrist ulnar deviation    Wrist radial deviation    Wrist pronation    Wrist supination     (Blank rows = not tested)  UPPER EXTREMITY MMT:  MMT Right eval Left eval  Shoulder flexion    Shoulder extension    Shoulder abduction    Shoulder adduction    Shoulder extension    Shoulder internal rotation    Shoulder external rotation    Middle trapezius    Lower trapezius    Elbow flexion  5-  Elbow extension  5-  Wrist flexion    Wrist extension    Wrist ulnar deviation    Wrist radial deviation    Wrist pronation    Wrist supination    Grip strength 94 ftlbs 91.6 ftlbs   (Blank rows = not tested)  CERVICAL SPECIAL TESTS:  Spurling's test: Positive, Distraction test: Positive, and Compression Test neg  FUNCTIONAL TESTS:   none  TREATMENT DATE:  04/18/24 Cervical UBE 3/3 Tband rows, extension, LPD  blue 3x10 Manual  Cervical SOR, manual traction, UT stretching     04/14/24 UBE 3/3 Tband rows, extension, LPD  blue 3x10 Manual  Cervical SOR, manual traction, UT stretching   04/01/24  Initial evaluation completed followed by trial set of HEP and manualtraction.  PATIENT EDUCATION:  Education details: HEP Person educated: Patient Education method: Explanation, Demonstration, and Actor cues Education comprehension: verbalized  understanding and returned demonstration  HOME EXERCISE PROGRAM: Access Code: [email or web address] URL: [email or web address] Date: 04/18/2024 Prepared by: Burnard Meth  Exercises - Supine Cervical Rotation AROM on Pillow  - 2 x daily - 7 x weekly - 2 sets - 8 reps - Shoulder Extension with Resistance  - 1 x daily - 7 x weekly - 3 sets - 10 reps - Standing Shoulder Row with Anchored Resistance  - 1 x daily - 7 x weekly - 3 sets - 10 reps - Standing Lat Pull Down with Resistance - Elbows Bent  - 1 x daily - 7 x weekly - 3 sets - 10 reps   ASSESSMENT:  CLINICAL IMPRESSION: Patient needed VC and tactile cues for proper form with T band LPD.  Demonstrated understanding.  OBJECTIVE IMPAIRMENTS: decreased ROM, impaired flexibility, and pain.   ACTIVITY LIMITATIONS: computer work  PARTICIPATION LIMITATIONS: community activity and exercise  PERSONAL FACTORS: None- are also affecting patient's functional outcome.   REHAB POTENTIAL: Good  CLINICAL DECISION MAKING: Stable/uncomplicated  EVALUATION COMPLEXITY: Moderate   GOALS: Goals reviewed with patient? Yes  SHORT TERM GOALS: Target date: 04/22/2024    Pt to be independent with HEP. Baseline:  Goal status: MET 04/18/24  2.  Decrease pain by 1 level. Baseline:  Goal status:MET 04/18/24  LONG TERM GOALS: Target date:05/27/2024  Centralize radiating symptoms by 50% or more. Baseline:  Goal status: INITIAL  2.  Pt to be independent with self progressive HEP by discharge.  Baseline:  Goal status: INITIAL  3.  Decrease max pain to 2/10 with all activities. Baseline:  Goal status: INITIAL  4.  Increase cervical AROM by 25%. Baseline:  Goal status: INITIAL  PLAN:  PT FREQUENCY: 1-2x/week  PT DURATION: 8 weeks  PLANNED INTERVENTIONS: 97164- PT Re-evaluation, 97110-Therapeutic exercises, 97530- Therapeutic activity, [id number]- Neuromuscular re-education, 97535- Self Care, 02859- Manual therapy, 02987- Traction  (mechanical), Patient/Family education, Joint mobilization, and Moist heat  PLAN FOR NEXT SESSION: Continue on postural exercises.   Burnard Meth, PT 04/18/24  12:18 PM     Date of referral: 03/22/24 Referring provider: Georgina Ozell LABOR, MD  Referring diagnosis?  Diagnosis  M54.12 (ICD-10-CM) - Radiculopathy, cervical region   Treatment diagnosis? (if different than referring diagnosis) m54.12, m54.2 , r29.3  What was this (referring dx) caused by? Unspecified  Nature of Condition: Initial Onset (within last 3 months)   Laterality: Lt  Current Functional Measure Score: Patient Specific Functional Scale 7.66  Objective measurements identify impairments when they are compared to normal values, the uninvolved extremity, and prior level of function.  [x]  Yes  []  No  Objective assessment of functional ability: Minimal functional limitations   Briefly describe symptoms: See Above  How did symptoms start: No cause  Average pain intensity:  Last 24 hours: 3/10  Past week: 8/10  How often does the pt experience symptoms? Occasionally  How much have the symptoms interfered with usual daily activities? Moderately  How has condition changed since care began at this facility? NA - initial visit  In general, how is the patients overall health? Very Good   BACK PAIN (STarT Back Screening Tool) No    Burnard CHRISTELLA Meth, PT 04/18/2024, 12:18 PM

## 2024-04-18 ENCOUNTER — Ambulatory Visit (INDEPENDENT_AMBULATORY_CARE_PROVIDER_SITE_OTHER)

## 2024-04-18 DIAGNOSIS — M542 Cervicalgia: Secondary | ICD-10-CM | POA: Diagnosis not present

## 2024-04-18 DIAGNOSIS — M5412 Radiculopathy, cervical region: Secondary | ICD-10-CM

## 2024-04-18 DIAGNOSIS — R293 Abnormal posture: Secondary | ICD-10-CM | POA: Diagnosis not present

## 2024-04-22 NOTE — Therapy (Signed)
 OUTPATIENT PHYSICAL THERAPY CERVICAL TREATMENT   Patient Name: Dennis Diaz MRN: [id number] DOB:23-Nov-1945, 78 y.o., male Today's Date: 04/25/2024  END OF SESSION:  PT End of Session - 04/25/24 0834     Visit Number 4    Number of Visits 17    Date for Recertification  05/27/24    PT Start Time 0800    PT Stop Time 0830    PT Time Calculation (min) 30 min    Activity Tolerance Patient tolerated treatment well    Behavior During Therapy [facility] for tasks assessed/performed             Past Medical History:  Diagnosis Date   Angio-edema    Diverticulitis    2016   ED (erectile dysfunction)    Hyperlipidemia    Kidney stones    2 episodes in 40 years    Urticaria    Past Surgical History:  Procedure Laterality Date   COLONOSCOPY     POLYPECTOMY     root canal     and redo   TONSILLECTOMY     as child   Patient Active Problem List   Diagnosis Date Noted   Cataract, nuclear sclerotic, both eyes 05/21/2021   Retinal holes, left 05/21/2021   Retinal hole, left 01/10/2021   Retinal hole of right eye 01/10/2021   Anaphylactic reaction due to food 04/25/2020   Perforated diverticulum of large intestine 04/15/2015   Diverticulitis of intestine with perforation without bleeding 04/15/2015    PCP: Valentin Skates, DO   REFERRING PROVIDER: Georgina Ozell LABOR, MD   REFERRING DIAG: M54.12 (ICD-10-CM) - Radiculopathy, cervical region   THERAPY DIAG:  Radiculopathy, cervical region  Cervicalgia  Abnormal posture  Rationale for Evaluation and Treatment: Rehabilitation  ONSET DATE: couple months ago  SUBJECTIVE:                                                                                                                                                                                                         SUBJECTIVE STATEMENT: Pt reports feeling less tingling and numbness since last visit.  Hand dominance: Right  PERTINENT HISTORY:  --  PAIN:  Are you  having pain? Yes: NPRS scale: 1/10 Pain location: L UT  Pain description: dull, tingling, numbness Aggravating factors: sitting, posture Relieving factors: medication  PRECAUTIONS: None  RED FLAGS: None     WEIGHT BEARING RESTRICTIONS: No  FALLS:  Has patient fallen in last 6 months? No  LIVING ENVIRONMENT: Lives with: lives with their spouse Lives in: House/apartment  OCCUPATION: retired,  working out, golf  PLOF: Independent  PATIENT GOALS: Decrease symptoms  NEXT MD VISIT: unknown  OBJECTIVE:  Note: Objective measures were completed at Evaluation unless otherwise noted.  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    PATIENT SURVEYS:  PSFS: THE PATIENT SPECIFIC FUNCTIONAL SCALE  Place score of 0-10 (0 = unable to perform activity and 10 = able to perform activity at the same level as before injury or problem)  Activity Date: 04/01/24    Overhead reaching with L 9    2.Golf 9    3.exercise at gym   5    4.      Total Score 7.66      Total Score = Sum of activity scores/number of activities  Minimally Detectable Change: 3 points (for single activity); 2 points (for average score)  [facility] (nd). The Patient Specific Functional Scale . Retrieved from [email or web address]   COGNITION: Overall cognitive status: Within functional limits for tasks assessed  SENSATION: L hand tingling--at thumb and 1st digit  POSTURE: mod forward head and rounded shoulders   PALPATION: 1+ medial UT   CERVICAL ROM:    AROM  A/PROM  eval  Flexion 75%  Extension 50%P  Right lateral flexion 25%  Left lateral flexion 50%P  Right rotation 75%  Left rotation 50%   (Blank rows = not tested)  UPPER EXTREMITY ROM:  Active ROM Right eval Left eval  Shoulder flexion    Shoulder extension    Shoulder abduction    Shoulder adduction    Shoulder extension    Shoulder internal rotation  50%  Shoulder external rotation    Elbow flexion    Elbow extension    Wrist flexion    Wrist extension    Wrist ulnar deviation    Wrist radial deviation    Wrist pronation    Wrist supination     (Blank rows = not tested)  UPPER EXTREMITY MMT:  MMT Right eval Left eval  Shoulder flexion    Shoulder extension    Shoulder abduction    Shoulder adduction    Shoulder extension    Shoulder internal rotation    Shoulder external rotation    Middle trapezius    Lower trapezius    Elbow flexion  5-  Elbow extension  5-  Wrist flexion    Wrist extension    Wrist ulnar deviation    Wrist radial deviation    Wrist pronation    Wrist supination    Grip strength 94 ftlbs 91.6 ftlbs   (Blank rows = not tested)  CERVICAL SPECIAL TESTS:  Spurling's test: Positive, Distraction test: Positive, and Compression Test neg  FUNCTIONAL TESTS:   none  TREATMENT DATE:  04/25/24 UBE 3/3 Tband rows, extension, LPD  blue 3x10 Manual  Cervical SOR, manual traction, UT stretching   04/18/24 Cervical UBE 3/3 Tband rows, extension, LPD  blue 3x10 Manual  Cervical SOR, manual traction, UT stretching     04/14/24 UBE 3/3 Tband rows, extension, LPD  blue 3x10 Manual  Cervical SOR, manual traction, UT stretching   04/01/24  Initial evaluation completed followed by trial set of HEP and manualtraction.                                                                                                                            PATIENT EDUCATION:  Education details: HEP Person educated: Patient Education method:  Explanation, Demonstration, and Actor cues Education comprehension: verbalized understanding and returned demonstration  HOME EXERCISE PROGRAM: Access Code: [email or web address] URL: [email or web address] Date: 04/18/2024 Prepared by: Burnard Meth  Exercises - Supine Cervical Rotation AROM on Pillow  - 2 x daily - 7 x weekly - 2 sets - 8 reps - Shoulder Extension with Resistance  - 1 x daily - 7 x weekly - 3 sets - 10 reps - Standing Shoulder Row with Anchored Resistance  - 1 x daily - 7 x weekly - 3 sets - 10 reps - Standing Lat Pull Down with Resistance - Elbows Bent  - 1 x daily - 7 x weekly - 3 sets - 10 reps   ASSESSMENT:  CLINICAL IMPRESSION: Patient continued to need VC and tactile cues for proper form with T band LPD.  Demonstrated understanding.  OBJECTIVE IMPAIRMENTS: decreased ROM, impaired flexibility, and pain.   ACTIVITY LIMITATIONS: computer work  PARTICIPATION LIMITATIONS: community activity and exercise  PERSONAL FACTORS: None- are also affecting patient's functional outcome.   REHAB POTENTIAL: Good  CLINICAL DECISION MAKING: Stable/uncomplicated  EVALUATION COMPLEXITY: Moderate   GOALS: Goals reviewed with patient? Yes  SHORT TERM GOALS: Target date: 04/22/2024    Pt to be independent with HEP. Baseline:  Goal status: MET 04/18/24  2.  Decrease pain by 1 level. Baseline:  Goal status:MET 04/18/24  LONG TERM GOALS: Target date:05/27/2024  Centralize radiating symptoms by 50% or more. Baseline:  Goal status: INITIAL  2.  Pt to be independent with self progressive HEP by discharge.  Baseline:  Goal status: INITIAL  3.  Decrease max pain to 2/10 with all activities. Baseline:  Goal status: INITIAL  4.  Increase cervical AROM by 25%. Baseline:  Goal status: INITIAL  PLAN:  PT FREQUENCY: 1-2x/week  PT DURATION: 8 weeks  PLANNED INTERVENTIONS: 97164- PT Re-evaluation, 97110-Therapeutic exercises, 97530- Therapeutic activity,  [id number]- Neuromuscular re-education, 97535- Self Care, 02859- Manual therapy, 02987- Traction (mechanical), Patient/Family education, Joint mobilization, and Moist heat  PLAN FOR NEXT SESSION: Continue on postural exercises.   Burnard Meth, PT 04/25/24  8:35 AM     Date of referral: 03/22/24 Referring provider: Georgina Ozell LABOR, MD  Referring diagnosis?  Diagnosis  M54.12 (ICD-10-CM) - Radiculopathy, cervical region   Treatment diagnosis? (if different than referring diagnosis) m54.12, m54.2 , r29.3  What was this (referring dx) caused by? Unspecified  Nature of Condition: Initial Onset (within last 3 months)   Laterality: Lt  Current Functional Measure Score: Patient Specific Functional Scale 7.66  Objective measurements identify impairments when they are compared to normal values, the uninvolved extremity, and prior level of function.  [x]  Yes  []   No  Objective assessment of functional ability: Minimal functional limitations   Briefly describe symptoms: See Above  How did symptoms start: No cause  Average pain intensity:  Last 24 hours: 3/10  Past week: 8/10  How often does the pt experience symptoms? Occasionally  How much have the symptoms interfered with usual daily activities? Moderately  How has condition changed since care began at this facility? NA - initial visit  In general, how is the patients overall health? Very Good   BACK PAIN (STarT Back Screening Tool) No    Burnard CHRISTELLA Meth, PT 04/25/2024, 8:35 AM

## 2024-04-25 ENCOUNTER — Ambulatory Visit (INDEPENDENT_AMBULATORY_CARE_PROVIDER_SITE_OTHER)

## 2024-04-25 DIAGNOSIS — M542 Cervicalgia: Secondary | ICD-10-CM | POA: Diagnosis not present

## 2024-04-25 DIAGNOSIS — M5412 Radiculopathy, cervical region: Secondary | ICD-10-CM

## 2024-04-25 DIAGNOSIS — R293 Abnormal posture: Secondary | ICD-10-CM

## 2024-05-02 ENCOUNTER — Encounter: Payer: Self-pay | Admitting: Radiology

## 2024-05-02 ENCOUNTER — Encounter

## 2024-05-08 NOTE — Therapy (Signed)
 OUTPATIENT PHYSICAL THERAPY CERVICAL TREATMENT   Patient Name: Dennis Diaz MRN: [id number] DOB:Feb 20, 1946, 78 y.o., male Today's Date: 05/08/2024  END OF SESSION:       Past Medical History:  Diagnosis Date   Angio-edema    Diverticulitis    2016   ED (erectile dysfunction)    Hyperlipidemia    Kidney stones    2 episodes in 40 years    Urticaria    Past Surgical History:  Procedure Laterality Date   COLONOSCOPY     POLYPECTOMY     root canal     and redo   TONSILLECTOMY     as child   Patient Active Problem List   Diagnosis Date Noted   Cataract, nuclear sclerotic, both eyes 05/21/2021   Retinal holes, left 05/21/2021   Retinal hole, left 01/10/2021   Retinal hole of right eye 01/10/2021   Anaphylactic reaction due to food 04/25/2020   Perforated diverticulum of large intestine 04/15/2015   Diverticulitis of intestine with perforation without bleeding 04/15/2015    PCP: Valentin Skates, DO   REFERRING PROVIDER: Valentin Skates, DO   REFERRING DIAG: [phone] (ICD-10-CM) - Radiculopathy, cervical region   THERAPY DIAG:  No diagnosis found.  Rationale for Evaluation and Treatment: Rehabilitation  ONSET DATE: couple months ago  SUBJECTIVE:                                                                                                                                                                                                         SUBJECTIVE STATEMENT: ***Pt reports feeling less tingling and numbness since last visit.  Hand dominance: Right  PERTINENT HISTORY:  --  PAIN:  ***Are you having pain? Yes: NPRS scale: 1/10 Pain location: L UT  Pain description: dull, tingling, numbness Aggravating factors: sitting, posture Relieving factors: medication  PRECAUTIONS: None  RED FLAGS: None     WEIGHT BEARING RESTRICTIONS: No  FALLS:  Has patient fallen in last 6 months? No  LIVING ENVIRONMENT: Lives with: lives with their spouse Lives  in: House/apartment  OCCUPATION: retired, working out, golf  PLOF: Independent  PATIENT GOALS: Decrease symptoms  NEXT MD VISIT: unknown  OBJECTIVE:  Note: Objective measures were completed at Evaluation unless otherwise noted.  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    PATIENT SURVEYS:  PSFS: THE PATIENT SPECIFIC FUNCTIONAL SCALE  Place score of 0-10 (0 = unable to perform activity and 10 = able to perform activity at the same level as before injury or problem)  Activity Date: 04/01/24    Overhead reaching with L 9    2.Golf 9    3.exercise at gym   5    4.      Total Score 7.66      Total Score = Sum of activity scores/number of activities  Minimally Detectable Change: 3 points (for single activity); 2 points (for average score)  [facility] (nd). The Patient Specific Functional Scale . Retrieved from [email or web address]   COGNITION: Overall cognitive status: Within functional limits for tasks assessed  SENSATION: L hand tingling--at thumb and 1st digit  POSTURE: mod forward head and rounded shoulders   PALPATION: 1+ medial UT   CERVICAL ROM:    AROM A/PROM  eval  Flexion 75%  Extension 50%P  Right lateral flexion 25%  Left lateral flexion 50%P  Right rotation 75%  Left rotation 50%   (Blank rows = not tested)  UPPER EXTREMITY ROM:  Active ROM Right eval Left eval  Shoulder flexion    Shoulder extension    Shoulder abduction    Shoulder adduction    Shoulder extension    Shoulder internal rotation   50%  Shoulder external rotation    Elbow flexion    Elbow extension    Wrist flexion    Wrist extension    Wrist ulnar deviation    Wrist radial deviation    Wrist pronation    Wrist supination     (Blank rows = not tested)  UPPER EXTREMITY MMT:  MMT Right eval Left eval  Shoulder flexion    Shoulder extension    Shoulder abduction    Shoulder adduction    Shoulder extension    Shoulder internal rotation    Shoulder external rotation    Middle trapezius    Lower trapezius    Elbow flexion  5-  Elbow extension  5-  Wrist flexion    Wrist extension    Wrist ulnar deviation    Wrist radial deviation    Wrist pronation    Wrist supination    Grip strength 94 ftlbs 91.6 ftlbs   (Blank rows = not tested)  CERVICAL SPECIAL TESTS:  Spurling's test: Positive, Distraction test: Positive, and Compression Test neg  FUNCTIONAL TESTS:   none  TREATMENT DATE:  05/09/24***    04/25/24 UBE 3/3 Tband rows, extension, LPD  blue 3x10 Manual  Cervical SOR, manual traction, UT stretching   04/18/24 Cervical UBE 3/3 Tband rows, extension, LPD  blue 3x10 Manual  Cervical SOR, manual traction, UT stretching     04/14/24 UBE 3/3 Tband rows, extension, LPD  blue 3x10 Manual  Cervical SOR, manual traction, UT stretching   04/01/24  Initial evaluation completed followed by trial set of HEP and manualtraction.  PATIENT EDUCATION:  Education details: HEP Person educated: Patient Education method: Explanation, Demonstration, and Actor cues Education comprehension: verbalized understanding and returned demonstration  HOME EXERCISE PROGRAM: Access Code: [email or web address] URL: [email or web address] Date: 04/18/2024 Prepared by: Burnard Meth  Exercises - Supine Cervical Rotation AROM on Pillow  - 2 x daily - 7 x weekly - 2 sets - 8  reps - Shoulder Extension with Resistance  - 1 x daily - 7 x weekly - 3 sets - 10 reps - Standing Shoulder Row with Anchored Resistance  - 1 x daily - 7 x weekly - 3 sets - 10 reps - Standing Lat Pull Down with Resistance - Elbows Bent  - 1 x daily - 7 x weekly - 3 sets - 10 reps   ASSESSMENT:  CLINICAL IMPRESSION: Patient continued to need VC and tactile cues for proper form with T band LPD.  Demonstrated understanding.  OBJECTIVE IMPAIRMENTS: decreased ROM, impaired flexibility, and pain.   ACTIVITY LIMITATIONS: computer work  PARTICIPATION LIMITATIONS: community activity and exercise  PERSONAL FACTORS: None- are also affecting patient's functional outcome.   REHAB POTENTIAL: Good  CLINICAL DECISION MAKING: Stable/uncomplicated  EVALUATION COMPLEXITY: Moderate   GOALS: Goals reviewed with patient? Yes  SHORT TERM GOALS: Target date: 04/22/2024  MET    Pt to be independent with HEP. Baseline:  Goal status: MET 04/18/24  2.  Decrease pain by 1 level. Baseline:  Goal status:MET 04/18/24  LONG TERM GOALS: Target date:05/27/2024  Centralize radiating symptoms by 50% or more. Baseline:  Goal status: INITIAL  2.  Pt to be independent with self progressive HEP by discharge.  Baseline:  Goal status: INITIAL  3.  Decrease max pain to 2/10 with all activities. Baseline:  Goal status: INITIAL  4.  Increase cervical AROM by 25%. Baseline:  Goal status: INITIAL  PLAN:  PT FREQUENCY: 1-2x/week  PT DURATION: 8 weeks  PLANNED INTERVENTIONS: 97164- PT Re-evaluation, 97110-Therapeutic exercises, 97530- Therapeutic activity, [id number]- Neuromuscular re-education, 97535- Self Care, 02859- Manual therapy, 02987- Traction (mechanical), Patient/Family education, Joint mobilization, and Moist heat  PLAN FOR NEXT SESSION: ***Continue on postural exercises.   Burnard Meth, PT 05/08/24  10:49 AM     Date of referral: 03/22/24 Referring provider: Valentin Skates,  DO  Referring diagnosis?  Diagnosis  M54.12 (ICD-10-CM) - Radiculopathy, cervical region   Treatment diagnosis? (if different than referring diagnosis) m54.12, m54.2 , r29.3  What was this (referring dx) caused by? Unspecified  Nature of Condition: Initial Onset (within last 3 months)   Laterality: Lt  Current Functional Measure Score: Patient Specific Functional Scale 7.66  Objective measurements identify impairments when they are compared to normal values, the uninvolved extremity, and prior level of function.  [x]  Yes  []  No  Objective assessment of functional ability: Minimal functional limitations   Briefly describe symptoms: See Above  How did symptoms start: No cause  Average pain intensity:  Last 24 hours: 3/10  Past week: 8/10  How often does the pt experience symptoms? Occasionally  How much have the symptoms interfered with usual daily activities? Moderately  How has condition changed since care began at this facility? NA - initial visit  In general, how is the patients overall health? Very Good   BACK PAIN (STarT Back Screening Tool) No    Burnard CHRISTELLA Meth, PT 05/08/2024, 10:49 AM

## 2024-05-09 ENCOUNTER — Ambulatory Visit

## 2024-05-09 DIAGNOSIS — M5412 Radiculopathy, cervical region: Secondary | ICD-10-CM | POA: Diagnosis not present

## 2024-05-09 DIAGNOSIS — R293 Abnormal posture: Secondary | ICD-10-CM | POA: Diagnosis not present

## 2024-05-09 DIAGNOSIS — M542 Cervicalgia: Secondary | ICD-10-CM

## 2024-05-15 NOTE — Therapy (Signed)
 OUTPATIENT PHYSICAL THERAPY CERVICAL TREATMENT   Patient Name: Dennis Diaz MRN: [id number] DOB:[birth year], 78 y.o., male Today's Date: 05/16/2024  END OF SESSION:  PT End of Session - 05/16/24 1142     Visit Number 6    Number of Visits 17    Date for Recertification  05/27/24    PT Start Time 1145    PT Stop Time 1215    PT Time Calculation (min) 30 min    Activity Tolerance Patient tolerated treatment well    Behavior During Therapy [facility] for tasks assessed/performed               Past Medical History:  Diagnosis Date   Angio-edema    Diverticulitis    2016   ED (erectile dysfunction)    Hyperlipidemia    Kidney stones    2 episodes in 40 years    Urticaria    Past Surgical History:  Procedure Laterality Date   COLONOSCOPY     POLYPECTOMY     root canal     and redo   TONSILLECTOMY     as child   Patient Active Problem List   Diagnosis Date Noted   Cataract, nuclear sclerotic, both eyes 05/21/2021   Retinal holes, left 05/21/2021   Retinal hole, left 01/10/2021   Retinal hole of right eye 01/10/2021   Anaphylactic reaction due to food 04/25/2020   Perforated diverticulum of large intestine 04/15/2015   Diverticulitis of intestine with perforation without bleeding 04/15/2015    PCP: Valentin Skates, DO   REFERRING PROVIDER: Georgina Ozell LABOR, MD   REFERRING DIAG: M54.12 (ICD-10-CM) - Radiculopathy, cervical region   THERAPY DIAG:  Radiculopathy, cervical region  Cervicalgia  Abnormal posture  Rationale for Evaluation and Treatment: Rehabilitation  ONSET DATE: couple months ago  SUBJECTIVE:                                                                                                                                                                                                         SUBJECTIVE STATEMENT: Pt reports minimal to no symptoms since last visit. Hand dominance: Right  PERTINENT HISTORY:  --  PAIN:  Are you having  pain? Yes: NPRS scale: 1/10 Pain location: L UT  Pain description: dull, tingling, numbness Aggravating factors: sitting, posture Relieving factors: medication  PRECAUTIONS: None  RED FLAGS: None     WEIGHT BEARING RESTRICTIONS: No  FALLS:  Has patient fallen in last 6 months? No  LIVING ENVIRONMENT: Lives with: lives with their spouse Lives in: House/apartment  OCCUPATION: retired,  working out, golf  PLOF: Independent  PATIENT GOALS: Decrease symptoms  NEXT MD VISIT: unknown  OBJECTIVE:  Note: Objective measures were completed at Evaluation unless otherwise noted.  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    PATIENT SURVEYS:  PSFS: THE PATIENT SPECIFIC FUNCTIONAL SCALE  Place score of 0-10 (0 = unable to perform activity and 10 = able to perform activity at the same level as before injury or problem)  Activity Date: 04/01/24    Overhead reaching with L 9    2.Golf 9    3.exercise at gym   5    4.      Total Score 7.66      Total Score = Sum of activity scores/number of activities  Minimally Detectable Change: 3 points (for single activity); 2 points (for average score)  [employer] [facility] (nd). The Patient Specific Functional Scale . Retrieved from [email or web address]   COGNITION: Overall cognitive status: Within functional limits for tasks assessed  SENSATION: L hand tingling--at thumb and 1st digit  POSTURE: mod forward head and rounded shoulders   PALPATION: 1+ medial UT   CERVICAL ROM:    AROM A/PROM   eval  Flexion 75%  Extension 50%P  Right lateral flexion 25%  Left lateral flexion 50%P  Right rotation 75%  Left rotation 50%   (Blank rows = not tested)  UPPER EXTREMITY ROM:  Active ROM Right eval Left eval  Shoulder flexion    Shoulder extension    Shoulder abduction    Shoulder adduction    Shoulder extension    Shoulder internal rotation  50%  Shoulder external rotation    Elbow flexion    Elbow extension    Wrist flexion    Wrist extension    Wrist ulnar deviation    Wrist radial deviation    Wrist pronation    Wrist supination     (Blank rows = not tested)  UPPER EXTREMITY MMT:  MMT Right eval Left eval  Shoulder flexion    Shoulder extension    Shoulder abduction    Shoulder adduction    Shoulder extension    Shoulder internal rotation    Shoulder external rotation    Middle trapezius    Lower trapezius    Elbow flexion  5-  Elbow extension  5-  Wrist flexion    Wrist extension    Wrist ulnar deviation    Wrist radial deviation    Wrist pronation    Wrist supination    Grip strength 94 ftlbs 91.6 ftlbs   (Blank rows = not tested)  CERVICAL SPECIAL TESTS:  Spurling's test: Positive, Distraction test: Positive, and Compression Test neg  FUNCTIONAL TESTS:   none  TREATMENT DATE:  05/16/24 UBE level 2 3 min each way Tband rows, extension, LPD  blue 3x10 TB blue ER bilateral 3x10 Manual  Cervical SOR, manual traction, UT stretching  05/09/24 Pulleys 3 min Tband rows, extension, LPD  blue 3x10 TB ER bilateral 3x10 Manual  Cervical SOR, manual traction, UT stretching   04/25/24 UBE 3/3 Tband rows, extension, LPD  blue 3x10 Manual  Cervical SOR, manual traction, UT stretching   04/18/24 Cervical UBE 3/3 Tband rows, extension, LPD  blue 3x10 Manual  Cervical SOR, manual traction, UT stretching     04/14/24 UBE 3/3 Tband rows, extension, LPD  blue 3x10 Manual  Cervical SOR, manual traction, UT stretching   04/01/24   Initial evaluation completed followed by trial set of HEP and manualtraction.                                                                                                                            PATIENT EDUCATION:  Education details: HEP Person educated: Patient Education method: Explanation, Demonstration, and Actor cues Education comprehension: verbalized understanding and returned demonstration  HOME EXERCISE PROGRAM: Access Code: [email or web address] URL: [email or web address] Date: 04/18/2024 Prepared by: Burnard Meth  Exercises - Supine Cervical Rotation AROM on Pillow  - 2 x daily - 7 x weekly - 2 sets - 8 reps - Shoulder Extension with Resistance  - 1 x daily - 7 x weekly - 3 sets - 10 reps - Standing Shoulder Row with Anchored Resistance  - 1 x daily - 7 x weekly - 3 sets - 10 reps - Standing Lat Pull Down with Resistance - Elbows Bent  - 1 x daily - 7 x weekly - 3 sets - 10 reps   ASSESSMENT:  CLINICAL IMPRESSION: Progressing toward final goals.    OBJECTIVE IMPAIRMENTS: decreased ROM, impaired flexibility, and pain.   ACTIVITY LIMITATIONS: computer work  PARTICIPATION LIMITATIONS: community activity and exercise  PERSONAL FACTORS: None- are also affecting patient's functional outcome.   REHAB POTENTIAL: Good  CLINICAL DECISION MAKING: Stable/uncomplicated  EVALUATION COMPLEXITY: Moderate   GOALS: Goals reviewed with patient? Yes  SHORT TERM GOALS: Target date: 04/22/2024  MET    Pt to be independent with HEP. Baseline:  Goal status: MET 04/18/24  2.  Decrease pain by 1 level. Baseline:  Goal status:MET 04/18/24  LONG TERM GOALS: Target date:05/27/2024  Centralize radiating symptoms by 50% or more. Baseline:  Goal status: INITIAL  2.  Pt to be independent with self progressive HEP by discharge.  Baseline:  Goal status: INITIAL  3.  Decrease max pain to 2/10 with all activities. Baseline:  Goal status: INITIAL  4.  Increase  cervical AROM by 25%. Baseline:  Goal status: INITIAL  PLAN:  PT FREQUENCY: 1-2x/week  PT DURATION: 8 weeks  PLANNED INTERVENTIONS: 97164- PT Re-evaluation, 97110-Therapeutic exercises, 97530- Therapeutic activity, [id number]- Neuromuscular re-education, 97535- Self Care, 02859- Manual therapy, 02987- Traction (mechanical), Patient/Family education, Joint mobilization, and Moist heat  PLAN FOR NEXT SESSION: Reassess for discharge next visit.   Burnard Meth, PT 05/16/24  11:43 AM     Date of referral: 03/22/24 Referring provider: Georgina Ozell LABOR, MD  Referring diagnosis?  Diagnosis  M54.12 (ICD-10-CM) - Radiculopathy, cervical region   Treatment diagnosis? (if different than referring diagnosis) m54.12, m54.2 , r29.3  What was this (referring dx) caused by? Unspecified  Nature of Condition:  Initial Onset (within last 3 months)   Laterality: Lt  Current Functional Measure Score: Patient Specific Functional Scale 7.66  Objective measurements identify impairments when they are compared to normal values, the uninvolved extremity, and prior level of function.  [x]  Yes  []  No  Objective assessment of functional ability: Minimal functional limitations   Briefly describe symptoms: See Above  How did symptoms start: No cause  Average pain intensity:  Last 24 hours: 3/10  Past week: 8/10  How often does the pt experience symptoms? Occasionally  How much have the symptoms interfered with usual daily activities? Moderately  How has condition changed since care began at this facility? NA - initial visit  In general, how is the patients overall health? Very Good   BACK PAIN (STarT Back Screening Tool) No    Burnard CHRISTELLA Meth, PT 05/16/2024, 11:43 AM

## 2024-05-16 ENCOUNTER — Ambulatory Visit

## 2024-05-16 DIAGNOSIS — M542 Cervicalgia: Secondary | ICD-10-CM

## 2024-05-16 DIAGNOSIS — M5412 Radiculopathy, cervical region: Secondary | ICD-10-CM | POA: Diagnosis not present

## 2024-05-16 DIAGNOSIS — R293 Abnormal posture: Secondary | ICD-10-CM | POA: Diagnosis not present

## 2024-05-20 NOTE — Therapy (Signed)
 OUTPATIENT PHYSICAL THERAPY CERVICAL TREATMENT   Patient Name: QUINLIN CONANT MRN: [id number] DOB:25-Oct-1945, 78 y.o., male Today's Date: 05/23/2024  END OF SESSION:    PT End of Session - 05/23/24 1253     Visit Number 7    Number of Visits 17    Date for Recertification  05/27/24    PT Start Time 1145    PT Stop Time 1223    PT Time Calculation (min) 38 min    Activity Tolerance Patient tolerated treatment well    Behavior During Therapy [facility] for tasks assessed/performed                Past Medical History:  Diagnosis Date   Angio-edema    Diverticulitis    2016   ED (erectile dysfunction)    Hyperlipidemia    Kidney stones    2 episodes in 40 years    Urticaria    Past Surgical History:  Procedure Laterality Date   COLONOSCOPY     POLYPECTOMY     root canal     and redo   TONSILLECTOMY     as child   Patient Active Problem List   Diagnosis Date Noted   Cataract, nuclear sclerotic, both eyes 05/21/2021   Retinal holes, left 05/21/2021   Retinal hole, left 01/10/2021   Retinal hole of right eye 01/10/2021   Anaphylactic reaction due to food 04/25/2020   Perforated diverticulum of large intestine 04/15/2015   Diverticulitis of intestine with perforation without bleeding 04/15/2015    PCP: Valentin Skates, DO   REFERRING PROVIDER: Georgina Ozell LABOR, MD   REFERRING DIAG: M54.12 (ICD-10-CM) - Radiculopathy, cervical region   THERAPY DIAG:  Radiculopathy, cervical region  Cervicalgia  Abnormal posture  Rationale for Evaluation and Treatment: Rehabilitation  ONSET DATE: couple months ago  SUBJECTIVE:                                                                                                                                                                                                         SUBJECTIVE STATEMENT: Pt reports minimal to no symptoms since last visit. Hand dominance: Right  PERTINENT HISTORY:  --  PAIN:  Are you  having pain? Yes: NPRS scale: 1/10 Pain location: L UT  Pain description: dull, tingling, numbness Aggravating factors: sitting, posture Relieving factors: medication  PRECAUTIONS: None  RED FLAGS: None     WEIGHT BEARING RESTRICTIONS: No  FALLS:  Has patient fallen in last 6 months? No  LIVING ENVIRONMENT: Lives with: lives with their spouse Lives in: House/apartment  OCCUPATION: retired, working out, golf  PLOF: Independent  PATIENT GOALS: Decrease symptoms  NEXT MD VISIT: unknown  OBJECTIVE:  Note: Objective measures were completed at Evaluation unless otherwise noted.  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    PATIENT SURVEYS:  PSFS: THE PATIENT SPECIFIC FUNCTIONAL SCALE  Place score of 0-10 (0 = unable to perform activity and 10 = able to perform activity at the same level as before injury or problem)  Activity Date: 04/01/24 Date: 05/23/24    Overhead reaching with L 9 10   2.Golf 9 9   3.exercise at gym   5 8   4.      Total Score 7.66 9.0     Total Score = Sum of activity scores/number of activities  Minimally Detectable Change: 3 points (for single activity); 2 points (for average score)  [employer] [facility] (nd). The Patient Specific Functional Scale . Retrieved from [email or web address]   COGNITION: Overall cognitive status: Within functional limits for tasks assessed  SENSATION: L hand tingling--at thumb and 1st digit  POSTURE: mod forward head and rounded shoulders   PALPATION: 1+ medial  UT   CERVICAL ROM:    AROM A/PROM  eval AROM 05/23/24  Flexion 75% 100%  Extension 50%P 75%  Right lateral flexion 25% 25%  Left lateral flexion 50%P 50%  Right rotation 75% 75%  Left rotation 50% 75%   (Blank rows = not tested)  UPPER EXTREMITY ROM:  Active ROM Right eval Left Eval/ 05/23/24  Shoulder flexion    Shoulder extension    Shoulder abduction    Shoulder adduction    Shoulder extension    Shoulder internal rotation  50%/ WNL  Shoulder external rotation    Elbow flexion    Elbow extension    Wrist flexion    Wrist extension    Wrist ulnar deviation    Wrist radial deviation    Wrist pronation    Wrist supination     (Blank rows = not tested)  UPPER EXTREMITY MMT:  MMT Right eval Left eval 11/24/255  Shoulder flexion     Shoulder extension     Shoulder abduction     Shoulder adduction     Shoulder extension     Shoulder internal rotation     Shoulder external rotation     Middle trapezius     Lower trapezius     Elbow flexion  5- 5  Elbow extension  5- 5  Wrist flexion     Wrist extension     Wrist ulnar deviation     Wrist radial deviation     Wrist pronation     Wrist supination     Grip strength 94 ftlbs 91.6 ftlbs 100.6   (Blank rows = not tested)  CERVICAL SPECIAL TESTS:  Spurling's test: Positive, Distraction test: Positive, and Compression Test neg  FUNCTIONAL TESTS:   none  TREATMENT DATE:  05/23/24 UBE level 2 3 min each way Tband rows, extension, LPD  blue 3x10 TB blue ER bilateral 3x10 Reassessment Manual  Cervical SOR, manual traction, UT stretching   05/16/24 UBE  level 2 3 min each way Tband rows, extension, LPD  blue 3x10 TB blue ER bilateral 3x10 Manual  Cervical SOR, manual traction, UT stretching  05/09/24 Pulleys 3 min Tband rows, extension, LPD  blue 3x10 TB ER bilateral 3x10 Manual  Cervical SOR, manual traction, UT stretching   04/25/24 UBE 3/3 Tband rows, extension, LPD  blue 3x10 Manual   Cervical SOR, manual traction, UT stretching   04/18/24 Cervical UBE 3/3 Tband rows, extension, LPD  blue 3x10 Manual  Cervical SOR, manual traction, UT stretching     04/14/24 UBE 3/3 Tband rows, extension, LPD  blue 3x10 Manual  Cervical SOR, manual traction, UT stretching   04/01/24  Initial evaluation completed followed by trial set of HEP and manualtraction.                                                                                                                            PATIENT EDUCATION:  Education details: HEP Person educated: Patient Education method: Explanation, Demonstration, and Actor cues Education comprehension: verbalized understanding and returned demonstration  HOME EXERCISE PROGRAM: Access Code: [email or web address] URL: [email or web address] Date: 04/18/2024 Prepared by: Burnard Meth  Exercises - Supine Cervical Rotation AROM on Pillow  - 2 x daily - 7 x weekly - 2 sets - 8 reps - Shoulder Extension with Resistance  - 1 x daily - 7 x weekly - 3 sets - 10 reps - Standing Shoulder Row with Anchored Resistance  - 1 x daily - 7 x weekly - 3 sets - 10 reps - Standing Lat Pull Down with Resistance - Elbows Bent  - 1 x daily - 7 x weekly - 3 sets - 10 reps   ASSESSMENT:  CLINICAL IMPRESSION: Pt has had 7 visits and progressed in ROM, strength and pain control.  He has minimal to no radiating symptoms.  All STG and LTG completed.  OBJECTIVE IMPAIRMENTS: decreased ROM, impaired flexibility, and pain.   ACTIVITY LIMITATIONS: computer work  PARTICIPATION LIMITATIONS: community activity and exercise  PERSONAL FACTORS: None- are also affecting patient's functional outcome.   REHAB POTENTIAL: Good  CLINICAL DECISION MAKING: Stable/uncomplicated  EVALUATION COMPLEXITY: Moderate   GOALS: Goals reviewed with patient? Yes  SHORT TERM GOALS: Target date: 04/22/2024  MET    Pt to be independent with HEP. Baseline:  Goal status: MET  04/18/24  2.  Decrease pain by 1 level. Baseline:  Goal status:MET 04/18/24  LONG TERM GOALS: Target date:05/27/2024  MET  Centralize radiating symptoms by 50% or more. Baseline:  Goal status: MET 05/23/24  2.  Pt to be independent with self progressive HEP by discharge.  Baseline:  Goal status: MET 05/23/24  3.  Decrease max pain to 2/10 with all activities. Baseline:  Goal status: MET 05/23/24  4.  Increase cervical AROM by 25%. Baseline:  Goal status: MET 05/23/24  PLAN:  PT FREQUENCY: 1-2x/week  PT DURATION: 8 weeks  PLANNED INTERVENTIONS: 97164- PT Re-evaluation, 97110-Therapeutic exercises, 97530- Therapeutic  activity, [id number]- Neuromuscular re-education, 97535- Self Care, [id number]- Manual therapy, [phone]- Traction (mechanical), Patient/Family education, Joint mobilization, and Moist heat  PLAN FOR NEXT SESSION: DC to HEP.   PHYSICAL THERAPY DISCHARGE SUMMARY  Visits from Start of Care: 7  Current functional level related to goals / functional outcomes: See above   Remaining deficits: See above   Education / Equipment: HEP   Patient agrees to discharge. Patient goals were met. Patient is being discharged due to meeting the stated rehab goals.   Burnard Meth, PT 05/23/24  12:54 PM     Date of referral: 03/22/24 Referring provider: Georgina Ozell LABOR, MD  Referring diagnosis?  Diagnosis  M54.12 (ICD-10-CM) - Radiculopathy, cervical region   Treatment diagnosis? (if different than referring diagnosis) m54.12, m54.2 , r29.3  What was this (referring dx) caused by? Unspecified  Nature of Condition: Initial Onset (within last 3 months)   Laterality: Lt  Current Functional Measure Score: Patient Specific Functional Scale 7.66  Objective measurements identify impairments when they are compared to normal values, the uninvolved extremity, and prior level of function.  [x]  Yes  []  No  Objective assessment of functional ability: Minimal functional  limitations   Briefly describe symptoms: See Above  How did symptoms start: No cause  Average pain intensity:  Last 24 hours: 3/10  Past week: 8/10  How often does the pt experience symptoms? Occasionally  How much have the symptoms interfered with usual daily activities? Moderately  How has condition changed since care began at this facility? NA - initial visit  In general, how is the patients overall health? Very Good   BACK PAIN (STarT Back Screening Tool) No    Burnard CHRISTELLA Meth, PT 05/23/2024, 12:54 PM

## 2024-05-23 ENCOUNTER — Ambulatory Visit

## 2024-05-23 DIAGNOSIS — M5412 Radiculopathy, cervical region: Secondary | ICD-10-CM | POA: Diagnosis not present

## 2024-05-23 DIAGNOSIS — R293 Abnormal posture: Secondary | ICD-10-CM

## 2024-05-23 DIAGNOSIS — M542 Cervicalgia: Secondary | ICD-10-CM

## 2024-06-07 ENCOUNTER — Other Ambulatory Visit (HOSPITAL_BASED_OUTPATIENT_CLINIC_OR_DEPARTMENT_OTHER): Payer: Self-pay

## 2024-06-07 MED ORDER — COMIRNATY 30 MCG/0.3ML IM SUSY
0.3000 mL | PREFILLED_SYRINGE | Freq: Once | INTRAMUSCULAR | 0 refills | Status: AC
  Filled 2024-06-07: qty 0.3, 1d supply, fill #0

## 2024-07-13 ENCOUNTER — Ambulatory Visit: Admitting: Orthopedic Surgery

## 2024-07-13 DIAGNOSIS — M5412 Radiculopathy, cervical region: Secondary | ICD-10-CM

## 2024-07-13 NOTE — Progress Notes (Signed)
 Orthopedic Spine Surgery Office Note   Assessment: Patient is a 79  y.o. male with left arm, forearm, and thumb/index finger pain/numbness/paresthesias, consistent with cervical radiculopathy     Plan: -Went over symptoms of myelopathy again with him.  Told him that should he develop the symptoms, he needs to come in sooner.  His exam is unchanged and he has no symptoms of myelopathy, so no surgery recommended at this time -Since he did notice improvement with the home exercise program and PT, encouraged him to continue with the home exercise program -In order to try to give him further relief, I recommended a cervical ESI.  Referral provided to him today -Patient has tried PT, tylenol , prednisone  -Patient should return to office in 3 months, x-rays at next visit: none     Patient expressed understanding of the plan and all questions were answered to the patient's satisfaction.    ___________________________________________________________________________     History:   Patient is a 79 y.o. male who presents today for follow-up on his cervical spine.  Patient has noticed improvement in his radiating left upper extremity pain.  He still notes pain along the posterior aspect of the shoulder going into the lateral arm and dorsal forearm.  He is also noticed numbness and paresthesias in those areas.  He has been doing PT since he was last seen in the office.  He has noticed improvement but not complete relief of his symptoms.  He particularly notes the symptoms if he bends his neck backwards or looks towards the left.  He does note the pain fairly regularly but is not on a daily basis.  It also is not throughout the day.  He has not noticed any trouble with fine motor skills in the hands.  Has not had any unsteadiness or imbalance.  He is ambulating without any assistive devices.     Treatments tried: PT, tylenol , prednisone     Physical Exam:   General: no acute distress, appears stated  age Neurologic: alert, answering questions appropriately, following commands Respiratory: unlabored breathing on room air, symmetric chest rise Psychiatric: appropriate affect, normal cadence to speech     MSK (spine):   -Strength exam                                                   Left                  Right Grip strength                5/5                  5/5 Interosseus                  5/5                  5/5 Wrist extension            5/5                  5/5 Wrist flexion                 5/5                  5/5 Elbow flexion  5/5                  5/5 Deltoid                          5/5                  5/5   EHL                              5/5                  5/5 TA                                 5/5                  5/5 GSC                             5/5                  5/5 Knee extension            5/5                  5/5 Hip flexion                    5/5                  5/5   -Sensory exam                           Sensation intact to light touch in L3-S1 nerve distributions of bilateral lower extremities             Sensation intact to light touch in C5-T1 nerve distributions of bilateral upper extremities   -Brachioradialis DTR: 2/4 on the left, 2/4 on the right -Biceps DTR: 2/4 on the left, 2/4 on the right -Achilles DTR: 2/4 on the left, 2/4 on the right -Patellar tendon DTR: 3/4 on the left, 3/4 on the right   -Spurling: positive on the left, negative on the right -Hoffman sign: positive on the right, negative on the left -Clonus: no beats bilaterally -Interosseous wasting: none seen -Grip and release test: negative -Romberg: negative  -Gait: normal    Imaging: XRs of the cervical spine from 04/11/2024 was previously independently reviewed and interpreted, showing disc height loss with anterior osteophyte formation at C5/6.  Disc height loss at C6/7.  No evidence of instability on flexion/extension views.  No fracture or  dislocation seen.   MRI of the cervical spine from 03/20/2024 was previously independently reviewed and interpreted, showing congenital stenosis. Foraminal stenosis on the left at C3/4. Central and bilateral foraminal stenosis at C5/6. Central and foraminal stenosis on the left at C6/7 due to a paracentral disc herniation. T2 cord signal change seen posterior to the C5/6 disc space.      Patient name: Dennis Diaz Patient MRN: [id number] Date of visit: 07/13/2024

## 2024-08-01 ENCOUNTER — Encounter: Admitting: Physical Medicine and Rehabilitation

## 2024-08-11 ENCOUNTER — Encounter: Admitting: Physical Medicine and Rehabilitation

## 2024-10-12 ENCOUNTER — Ambulatory Visit: Admitting: Orthopedic Surgery
